# Patient Record
Sex: Female | Born: 1955 | Race: White | Hispanic: No | Marital: Married | Smoking: Former smoker
Health system: Southern US, Community
[De-identification: ages and names within clinical notes are randomized; demographics above are authoritative.]

## PROBLEM LIST (undated history)

## (undated) DIAGNOSIS — E669 Obesity, unspecified: Secondary | ICD-10-CM

## (undated) DIAGNOSIS — K219 Gastro-esophageal reflux disease without esophagitis: Secondary | ICD-10-CM

## (undated) DIAGNOSIS — E119 Type 2 diabetes mellitus without complications: Secondary | ICD-10-CM

## (undated) DIAGNOSIS — K649 Unspecified hemorrhoids: Secondary | ICD-10-CM

## (undated) DIAGNOSIS — N920 Excessive and frequent menstruation with regular cycle: Secondary | ICD-10-CM

## (undated) DIAGNOSIS — K589 Irritable bowel syndrome without diarrhea: Secondary | ICD-10-CM

## (undated) DIAGNOSIS — S42309A Unspecified fracture of shaft of humerus, unspecified arm, initial encounter for closed fracture: Secondary | ICD-10-CM

## (undated) DIAGNOSIS — I499 Cardiac arrhythmia, unspecified: Secondary | ICD-10-CM

## (undated) DIAGNOSIS — I10 Essential (primary) hypertension: Secondary | ICD-10-CM

## (undated) DIAGNOSIS — I639 Cerebral infarction, unspecified: Secondary | ICD-10-CM

## (undated) DIAGNOSIS — S92909A Unspecified fracture of unspecified foot, initial encounter for closed fracture: Secondary | ICD-10-CM

## (undated) HISTORY — DX: Obesity, unspecified: E66.9

## (undated) HISTORY — DX: Cerebral infarction, unspecified: I63.9

## (undated) HISTORY — PX: TUBAL LIGATION: SHX77

## (undated) HISTORY — DX: Unspecified fracture of shaft of humerus, unspecified arm, initial encounter for closed fracture: S42.309A

## (undated) HISTORY — DX: Essential (primary) hypertension: I10

## (undated) HISTORY — DX: Unspecified hemorrhoids: K64.9

## (undated) HISTORY — DX: Irritable bowel syndrome, unspecified: K58.9

## (undated) HISTORY — DX: Excessive and frequent menstruation with regular cycle: N92.0

## (undated) HISTORY — DX: Type 2 diabetes mellitus without complications: E11.9

## (undated) HISTORY — PX: MOUTH SURGERY: SHX715

## (undated) HISTORY — DX: Unspecified fracture of unspecified foot, initial encounter for closed fracture: S92.909A

## (undated) HISTORY — PX: VAGINAL HYSTERECTOMY: SHX2639

---

## 1997-10-01 ENCOUNTER — Other Ambulatory Visit: Admission: RE | Admit: 1997-10-01 | Discharge: 1997-10-01 | Payer: Self-pay | Admitting: Obstetrics and Gynecology

## 1998-10-06 ENCOUNTER — Ambulatory Visit (HOSPITAL_COMMUNITY): Admission: RE | Admit: 1998-10-06 | Discharge: 1998-10-06 | Payer: Self-pay | Admitting: Obstetrics and Gynecology

## 1998-10-06 ENCOUNTER — Other Ambulatory Visit: Admission: RE | Admit: 1998-10-06 | Discharge: 1998-10-06 | Payer: Self-pay | Admitting: Obstetrics and Gynecology

## 1998-10-06 ENCOUNTER — Encounter: Payer: Self-pay | Admitting: Obstetrics and Gynecology

## 2001-09-04 ENCOUNTER — Other Ambulatory Visit: Admission: RE | Admit: 2001-09-04 | Discharge: 2001-09-04 | Payer: Self-pay | Admitting: Obstetrics and Gynecology

## 2004-01-20 ENCOUNTER — Other Ambulatory Visit: Admission: RE | Admit: 2004-01-20 | Discharge: 2004-01-20 | Payer: Self-pay | Admitting: Obstetrics and Gynecology

## 2004-07-26 ENCOUNTER — Ambulatory Visit: Payer: Self-pay | Admitting: Internal Medicine

## 2004-08-17 ENCOUNTER — Ambulatory Visit: Payer: Self-pay | Admitting: Internal Medicine

## 2006-10-19 LAB — HM COLONOSCOPY: HM Colonoscopy: NORMAL

## 2007-01-22 ENCOUNTER — Ambulatory Visit: Payer: Self-pay | Admitting: Internal Medicine

## 2007-02-14 DIAGNOSIS — S42309A Unspecified fracture of shaft of humerus, unspecified arm, initial encounter for closed fracture: Secondary | ICD-10-CM

## 2007-02-14 DIAGNOSIS — S92909A Unspecified fracture of unspecified foot, initial encounter for closed fracture: Secondary | ICD-10-CM

## 2007-02-14 HISTORY — DX: Unspecified fracture of unspecified foot, initial encounter for closed fracture: S92.909A

## 2007-02-14 HISTORY — DX: Unspecified fracture of shaft of humerus, unspecified arm, initial encounter for closed fracture: S42.309A

## 2007-05-23 ENCOUNTER — Inpatient Hospital Stay: Payer: Self-pay | Admitting: Internal Medicine

## 2008-02-14 DIAGNOSIS — I639 Cerebral infarction, unspecified: Secondary | ICD-10-CM

## 2008-02-14 HISTORY — DX: Cerebral infarction, unspecified: I63.9

## 2008-03-25 ENCOUNTER — Inpatient Hospital Stay: Payer: Self-pay | Admitting: Internal Medicine

## 2008-09-25 LAB — HM MAMMOGRAPHY: HM Mammogram: NORMAL

## 2009-08-10 ENCOUNTER — Ambulatory Visit: Payer: Self-pay | Admitting: Internal Medicine

## 2009-08-16 LAB — HM DEXA SCAN

## 2009-11-02 ENCOUNTER — Ambulatory Visit: Payer: Self-pay | Admitting: Internal Medicine

## 2010-05-26 ENCOUNTER — Emergency Department: Payer: Self-pay | Admitting: Emergency Medicine

## 2010-06-08 ENCOUNTER — Ambulatory Visit: Payer: Self-pay | Admitting: Internal Medicine

## 2010-10-21 ENCOUNTER — Telehealth: Payer: Self-pay | Admitting: Internal Medicine

## 2010-10-21 NOTE — Telephone Encounter (Signed)
Called patients work number and got answering machine, called home number and it says that it has been disconnected. I will try calling her work number again on Monday.

## 2010-10-21 NOTE — Telephone Encounter (Signed)
Agree. Lets see her early next week. If she develops any chest pain, chest tightness, sweating, nausea, then she should be seen in the ED over the weekend. It would also help if she records her episodes and when they occur, so we can determine if any triggers.

## 2010-10-21 NOTE — Telephone Encounter (Signed)
Pt's felt her heart tremble /rbh Had ashley to speak to pt

## 2010-10-21 NOTE — Telephone Encounter (Signed)
Spoke with patient . She says that she has had different episodes all lasting around 15 seconds where she can feel her hear shaking. She has no other symptoms, just wanted to schedule appt to have it checked out and to also have her blood pressure checked. She scheduled appt for next week.

## 2010-10-24 NOTE — Telephone Encounter (Signed)
Patient notified

## 2010-10-27 ENCOUNTER — Ambulatory Visit (INDEPENDENT_AMBULATORY_CARE_PROVIDER_SITE_OTHER): Payer: 59 | Admitting: Internal Medicine

## 2010-10-27 ENCOUNTER — Encounter: Payer: Self-pay | Admitting: Internal Medicine

## 2010-10-27 VITALS — BP 161/90 | HR 67 | Temp 97.8°F | Resp 20 | Ht 67.0 in | Wt 232.0 lb

## 2010-10-27 DIAGNOSIS — J329 Chronic sinusitis, unspecified: Secondary | ICD-10-CM

## 2010-10-27 DIAGNOSIS — R002 Palpitations: Secondary | ICD-10-CM

## 2010-10-27 DIAGNOSIS — I10 Essential (primary) hypertension: Secondary | ICD-10-CM

## 2010-10-27 LAB — CBC WITH DIFFERENTIAL/PLATELET
Basophils Absolute: 0 10*3/uL (ref 0.0–0.1)
Basophils Relative: 0.4 % (ref 0.0–3.0)
Eosinophils Absolute: 0.2 10*3/uL (ref 0.0–0.7)
Eosinophils Relative: 2.4 % (ref 0.0–5.0)
HCT: 41.6 % (ref 36.0–46.0)
Hemoglobin: 13.9 g/dL (ref 12.0–15.0)
Lymphocytes Relative: 26.9 % (ref 12.0–46.0)
Lymphs Abs: 2.5 10*3/uL (ref 0.7–4.0)
MCHC: 33.3 g/dL (ref 30.0–36.0)
MCV: 84.7 fl (ref 78.0–100.0)
Monocytes Absolute: 0.7 10*3/uL (ref 0.1–1.0)
Monocytes Relative: 7.8 % (ref 3.0–12.0)
Neutro Abs: 5.8 10*3/uL (ref 1.4–7.7)
Neutrophils Relative %: 62.5 % (ref 43.0–77.0)
Platelets: 173 10*3/uL (ref 150.0–400.0)
RBC: 4.91 Mil/uL (ref 3.87–5.11)
RDW: 14.2 % (ref 11.5–14.6)
WBC: 9.3 10*3/uL (ref 4.5–10.5)

## 2010-10-27 LAB — TSH: TSH: 0.98 u[IU]/mL (ref 0.35–5.50)

## 2010-10-27 LAB — COMPREHENSIVE METABOLIC PANEL
ALT: 92 U/L — ABNORMAL HIGH (ref 0–35)
AST: 56 U/L — ABNORMAL HIGH (ref 0–37)
Albumin: 4 g/dL (ref 3.5–5.2)
Alkaline Phosphatase: 138 U/L — ABNORMAL HIGH (ref 39–117)
BUN: 15 mg/dL (ref 6–23)
CO2: 27 mEq/L (ref 19–32)
Calcium: 9 mg/dL (ref 8.4–10.5)
Chloride: 104 mEq/L (ref 96–112)
Creatinine, Ser: 0.9 mg/dL (ref 0.4–1.2)
GFR: 71.27 mL/min (ref >60.00)
Glucose, Bld: 145 mg/dL — ABNORMAL HIGH (ref 70–99)
Potassium: 3.6 mEq/L (ref 3.5–5.1)
Sodium: 138 mEq/L (ref 135–145)
Total Bilirubin: 0.6 mg/dL (ref 0.3–1.2)
Total Protein: 7.8 g/dL (ref 6.0–8.3)

## 2010-10-27 MED ORDER — AMOXICILLIN-POT CLAVULANATE 875-125 MG PO TABS: 1.0000 | ORAL_TABLET | Freq: Two times a day (BID) | ORAL | Status: AC

## 2010-10-27 NOTE — Progress Notes (Signed)
Subjective:    Patient ID: Katie Bernard, female    DOB: 11-14-1955, 55 y.o.   MRN: [id number]  HPI Ms. Jakubowicz is a 55 year old female with a history of stroke in the distant past who presents for an acute visit today complaining of intermittent palpitations over the last several months and nasal congestion for the past 2 weeks. In regards to the palpitations she says they previously occurred a few times per year. They're noted when she is resting. They're described as a fluttering in her chest. She does not have chest pain, diaphoresis, shortness of breath, or other symptoms during these episodes. The episodes resolve with rest alone. They typically last just a few seconds. They do not seem to be related to stress or exertion. Patient became concerned about these episodes after a colleague recently died from sudden cardiac death. She is concerned about the possibility of ventricular fibrillation.  In regards to her nasal congestion, she reports progressive nasal congestion with purulent rhinorrhea over the last 2 weeks. She describes a frontal headache. She has been using her Flonase with no improvement in symptoms. She has also had sore throat and mild ear pain bilaterally. She denies any fever, shortness of breath, cough or other symptoms. She has several known sick contacts as she cares for her 4 grandchildren.  Outpatient Encounter Prescriptions as of 10/27/2010  Medication Sig Dispense Refill  . amoxicillin-clavulanate (AUGMENTIN) 875-125 MG per tablet Take 1 tablet by mouth 2 (two) times daily.  20 tablet  0  . fluticasone (FLONASE) 50 MCG/ACT nasal spray         Review of Systems  Constitutional: Negative for fever, chills, diaphoresis, appetite change, fatigue and unexpected weight change.  HENT: Positive for congestion, sore throat, postnasal drip and sinus pressure. Negative for ear pain, trouble swallowing, neck pain and voice change.   Eyes: Negative for visual disturbance.  Respiratory:  Negative for cough, shortness of breath, wheezing and stridor.   Cardiovascular: Positive for palpitations. Negative for chest pain and leg swelling.  Gastrointestinal: Negative for nausea, vomiting, abdominal pain, diarrhea, constipation, blood in stool, abdominal distention and anal bleeding.  Genitourinary: Negative for dysuria and flank pain.  Musculoskeletal: Negative for myalgias, arthralgias and gait problem.  Skin: Negative for color change and rash.  Neurological: Negative for dizziness, weakness, light-headedness, numbness and headaches.  Hematological: Negative for adenopathy. Does not bruise/bleed easily.  Psychiatric/Behavioral: Negative for suicidal ideas, sleep disturbance and dysphoric mood. The patient is not nervous/anxious.     BP 161/90  Pulse 67  Temp(Src) 97.8 F (36.6 C) (Oral)  Resp 20  Ht 5\' 7"  (1.702 m)  Wt 232 lb (105.235 kg)  BMI 36.34 kg/m2  SpO2 99%     Objective:   Physical Exam  Constitutional: She is oriented to person, place, and time. She appears well-developed and well-nourished. No distress.  HENT:  Head: Normocephalic and atraumatic.  Right Ear: Hearing, external ear and ear canal normal. A middle ear effusion is present.  Left Ear: Hearing, external ear and ear canal normal. A middle ear effusion is present.  Nose: Mucosal edema present. Right sinus exhibits frontal sinus tenderness. Left sinus exhibits frontal sinus tenderness.  Mouth/Throat: Oropharynx is clear and moist. No oropharyngeal exudate.  Eyes: Conjunctivae are normal. Pupils are equal, round, and reactive to light. Right eye exhibits no discharge. Left eye exhibits no discharge. No scleral icterus.  Neck: Normal range of motion. Neck supple. Carotid bruit is not present. No tracheal deviation present. No  thyromegaly present.  Cardiovascular: Normal rate, regular rhythm, normal heart sounds and intact distal pulses.  Exam reveals no gallop and no friction rub.   No murmur  heard. Pulmonary/Chest: Effort normal and breath sounds normal. No respiratory distress. She has no wheezes. She has no rales. She exhibits no tenderness.  Musculoskeletal: Normal range of motion. She exhibits no edema and no tenderness.  Lymphadenopathy:    She has no cervical adenopathy.  Neurological: She is alert and oriented to person, place, and time. No cranial nerve deficit. She exhibits normal muscle tone. Coordination normal.  Skin: Skin is warm and dry. No rash noted. She is not diaphoretic. No erythema. No pallor.  Psychiatric: She has a normal mood and affect. Her behavior is normal. Judgment and thought content normal.          Assessment & Plan:  1. Palpitations - Patient with recent recent increased frequency of palpitations.  Suspect related to anxiety.EKG performed today was normal. Will check electrolytes and thyroid function with labs. BP is up slightly today, but has been low-normal on previous visits. If it continues to be elevated, she might benefit from a betablocker. We will also set her up for cardiology eval and stress test. She will return to clinic in one month.  2. Frontal Sinusitis - patient with bilateral frontal sinusitis. Will treat empirically with Augmentin. She will use ibuprofen as needed for pain and inflammation. She will return to clinic in one month or call earlier should her symptoms not improve.

## 2010-10-27 NOTE — Patient Instructions (Signed)
EKG and labs today. We will set you up with Cardiology for stress test.  Follow up in 1 month.

## 2010-11-08 ENCOUNTER — Ambulatory Visit (INDEPENDENT_AMBULATORY_CARE_PROVIDER_SITE_OTHER): Payer: 59 | Admitting: Cardiovascular Disease

## 2010-11-08 ENCOUNTER — Encounter: Payer: Self-pay | Admitting: Cardiovascular Disease

## 2010-11-08 DIAGNOSIS — R002 Palpitations: Secondary | ICD-10-CM | POA: Insufficient documentation

## 2010-11-08 DIAGNOSIS — I10 Essential (primary) hypertension: Secondary | ICD-10-CM

## 2010-11-08 NOTE — Patient Instructions (Addendum)
You are doing well. Please start aspriin 81 mg daily Try red yeast rice Please call us if you have new issues that need to be addressed  Call when your sinus infection has resolved for a treadmill study.

## 2010-11-08 NOTE — Assessment & Plan Note (Addendum)
Her heart palpitations are rare, very short-lived. He would be very challenging to catch this on a Holter or event monitor as she has only had 3 episodes this year. She is not interested in any medication at this time for her palpitations. He did suggest that if her symptoms start occurring more frequently or for longer duration, that she call us for further evaluation. Her palpitations could have been APCs, PVCs or possibly short run of SVT. If a blood pressure medication as needed, we could take a low-dose beta blocker such as bystolic. She did bring walking shoes with her for possible treadmill study. As she is having difficulty breathing secondary to her sinus infection, we will delay a treadmill study for another day and do this at her convenience.

## 2010-11-08 NOTE — Assessment & Plan Note (Signed)
Blood pressure is borderline elevated today though she does have a sinus infection. We have asked her to monitor her blood pressure at home after she improves.

## 2010-11-08 NOTE — Progress Notes (Signed)
   Patient ID: Katie Bernard, female    DOB: 1956-02-13, 55 y.o.   MRN: [id number]  HPI Comments: Ms. 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 Dan Humphreys for symptoms of palpitations.  She has been fighting a sinus infection for the past 3-4 weeks.  She reports having a sinus infection over the past month. She has completed the amoxicillin for 10 days and has been off the antibiotics for 4 days. She continues to have ear congestion, nasal sinus congestion and headache. She does have mild possible postnasal drip.  She otherwise feels well. She does report a recent episode where a coworker fell down in the parking lot and despite attempts for resuscitation, he died. Since then, she is aware of any extra beats that she might have. She does report 3 episodes total of palpitations lasting for several seconds at a time. He most recent was around Labor Day soon after her friend/coworker passed. She had 3 seconds of fast beating while lying back in a recliner. This resolved after sitting forward. She is otherwise reactive, denies any chest pain, shortness of breath. She denies any palpitations that keep her awake at night time. She takes care of 4 children, works full-time and is married.  EKG shows normal sinus rhythm with rate 70 beats per minute with no significant ST or T wave changes   Outpatient Encounter Prescriptions as of 11/08/2010  Medication Sig Dispense Refill  . fluticasone (FLONASE) 50 MCG/ACT nasal spray          Review of Systems  Constitutional: Negative.   HENT: Negative.   Eyes: Negative.   Respiratory: Negative.   Cardiovascular: Positive for palpitations.  Gastrointestinal: Negative.   Musculoskeletal: Negative.   Skin: Negative.   Neurological: Negative.   Hematological: Negative.   Psychiatric/Behavioral: Negative.     All other systems reviewed and are negative.    BP 155/98  Pulse 70  Ht 5\' 7"  (1.702 m)  Wt 230 lb 12 oz (104.668 kg)  BMI 36.14 kg/m2   Physical Exam  Nursing note and vitals reviewed. Constitutional: She is oriented to person, place, and time. She appears well-developed and well-nourished.  HENT:  Head: Normocephalic.       Nasal congestion  Eyes: Conjunctivae are normal. Pupils are equal, round, and reactive to light.  Neck: Normal range of motion. Neck supple. No JVD present.  Cardiovascular: Normal rate, regular rhythm, S1 normal, S2 normal, normal heart sounds and intact distal pulses.  Exam reveals no gallop and no friction rub.   No murmur heard. Pulmonary/Chest: Effort normal and breath sounds normal. No respiratory distress. She has no wheezes. She has no rales. She exhibits no tenderness.  Abdominal: Soft. Bowel sounds are normal. She exhibits no distension. There is no tenderness.  Musculoskeletal: Normal range of motion. She exhibits no edema and no tenderness.  Lymphadenopathy:    She has no cervical adenopathy.  Neurological: She is alert and oriented to person, place, and time. Coordination normal.  Skin: Skin is warm and dry. No rash noted. No erythema.  Psychiatric: She has a normal mood and affect. Her behavior is normal. Judgment and thought content normal.         Assessment and Plan

## 2010-11-09 ENCOUNTER — Telehealth: Payer: Self-pay | Admitting: Internal Medicine

## 2010-11-09 NOTE — Telephone Encounter (Signed)
Patient request rx to be called in due to sinusitis symptoms still existing.

## 2010-11-09 NOTE — Telephone Encounter (Signed)
Called in Rx to [facility] and informed patient to try Levaquin and if this doesn't work to call us back to setup an appt.

## 2010-11-09 NOTE — Telephone Encounter (Signed)
We can try changing her to Levaquin 750mg  daily. Disp 10.  If this does not work, then we should see her back.

## 2010-11-09 NOTE — Telephone Encounter (Signed)
After 5  Call [phone]  Pt is better but not well is there is anything else she can take.  Her head is congested can't hear mucas is still dark green still has cough [facility]  [place] D[place]

## 2010-11-23 ENCOUNTER — Ambulatory Visit (INDEPENDENT_AMBULATORY_CARE_PROVIDER_SITE_OTHER): Payer: 59 | Admitting: Internal Medicine

## 2010-11-23 ENCOUNTER — Encounter: Payer: Self-pay | Admitting: Internal Medicine

## 2010-11-23 VITALS — BP 146/93 | HR 66 | Temp 98.6°F | Resp 20 | Ht 67.0 in | Wt 233.2 lb

## 2010-11-23 DIAGNOSIS — J0100 Acute maxillary sinusitis, unspecified: Secondary | ICD-10-CM

## 2010-11-23 DIAGNOSIS — J329 Chronic sinusitis, unspecified: Secondary | ICD-10-CM

## 2010-11-23 MED ORDER — SULFAMETHOXAZOLE-TRIMETHOPRIM 800-160 MG PO TABS
1.0000 | ORAL_TABLET | Freq: Two times a day (BID) | ORAL | Status: AC
Start: 2010-11-23 — End: 2010-12-03

## 2010-11-23 NOTE — Patient Instructions (Signed)
Call if symptoms not improving by Friday.

## 2010-11-23 NOTE — Progress Notes (Signed)
Subjective:    Patient ID: Katie Bernard, female    DOB: May 15, 1955, 55 y.o.   MRN: [id number]  HPI Katie Bernard is a 55 year old female with a recent history of bilateral maxillary sinusitis who presents for followup. Last month, she was treated with Levaquin for sinusitis. She reports modest improvement after this course of antibiotics. However, she subsequently had a recurrence of her symptoms including facial pain and purulent sinus drainage. She denies fever or chills.  Outpatient Encounter Prescriptions as of 11/23/2010  Medication Sig Dispense Refill  . fluticasone (FLONASE) 50 MCG/ACT nasal spray         Review of Systems  Constitutional: Negative for fever, chills, appetite change, fatigue and unexpected weight change.  HENT: Positive for ear pain, congestion and sinus pressure. Negative for sore throat, trouble swallowing, neck pain and voice change.   Eyes: Negative for visual disturbance.  Respiratory: Negative for cough, shortness of breath, wheezing and stridor.   Cardiovascular: Negative for chest pain, palpitations and leg swelling.  Gastrointestinal: Negative for nausea, vomiting, abdominal pain, diarrhea, constipation, blood in stool, abdominal distention and anal bleeding.  Genitourinary: Negative for dysuria and flank pain.  Musculoskeletal: Negative for myalgias, arthralgias and gait problem.  Skin: Negative for color change and rash.  Neurological: Positive for headaches. Negative for dizziness.  Hematological: Negative for adenopathy. Does not bruise/bleed easily.  Psychiatric/Behavioral: Negative for suicidal ideas, sleep disturbance and dysphoric mood. The patient is not nervous/anxious.    BP 146/93  Pulse 66  Temp(Src) 98.6 F (37 C) (Oral)  Resp 20  Ht 5\' 7"  (1.702 m)  Wt 233 lb 4 oz (105.802 kg)  BMI 36.53 kg/m2  SpO2 96%     Objective:   Physical Exam  Constitutional: She is oriented to person, place, and time. She appears well-developed and  well-nourished. No distress.  HENT:  Head: Normocephalic and atraumatic.  Right Ear: External ear normal. Tympanic membrane is not erythematous. A middle ear effusion is present.  Left Ear: External ear normal. Tympanic membrane is erythematous. A middle ear effusion is present.  Nose: Mucosal edema present. Right sinus exhibits maxillary sinus tenderness and frontal sinus tenderness. Left sinus exhibits maxillary sinus tenderness and frontal sinus tenderness.  Mouth/Throat: Oropharynx is clear and moist. No oropharyngeal exudate.  Eyes: Conjunctivae are normal. Pupils are equal, round, and reactive to light. Right eye exhibits no discharge. Left eye exhibits no discharge. No scleral icterus.  Neck: Normal range of motion. Neck supple. No tracheal deviation present. No thyromegaly present.  Cardiovascular: Normal rate, regular rhythm, normal heart sounds and intact distal pulses.  Exam reveals no gallop and no friction rub.   No murmur heard. Pulmonary/Chest: Effort normal and breath sounds normal. No respiratory distress. She has no wheezes. She has no rales. She exhibits no tenderness.  Musculoskeletal: Normal range of motion. She exhibits no edema and no tenderness.  Lymphadenopathy:    She has no cervical adenopathy.  Neurological: She is alert and oriented to person, place, and time. No cranial nerve deficit. She exhibits normal muscle tone. Coordination normal.  Skin: Skin is warm and dry. No rash noted. She is not diaphoretic. No erythema. No pallor.  Psychiatric: She has a normal mood and affect. Her behavior is normal. Judgment and thought content normal.          Assessment & Plan:  1. Sinusitis - patient with bilateral maxillary sinusitis. She has been told in the past that she has a deviated septum which may require  intervention. Will repeat course of antibiotics, this time using Bactrim with hope of improvement with better staph coverage. Should symptoms persist, patient will  followup with her ENT physician for further evaluation possibly to include culture.

## 2010-12-21 ENCOUNTER — Ambulatory Visit: Payer: 59 | Admitting: Internal Medicine

## 2011-02-02 ENCOUNTER — Encounter: Payer: Self-pay | Admitting: Internal Medicine

## 2011-02-02 ENCOUNTER — Ambulatory Visit (INDEPENDENT_AMBULATORY_CARE_PROVIDER_SITE_OTHER): Payer: 59 | Admitting: Internal Medicine

## 2011-02-02 VITALS — BP 140/86 | HR 75 | Temp 97.4°F | Wt 234.0 lb

## 2011-02-02 DIAGNOSIS — J111 Influenza due to unidentified influenza virus with other respiratory manifestations: Secondary | ICD-10-CM

## 2011-02-02 DIAGNOSIS — J101 Influenza due to other identified influenza virus with other respiratory manifestations: Secondary | ICD-10-CM

## 2011-02-02 DIAGNOSIS — R509 Fever, unspecified: Secondary | ICD-10-CM

## 2011-02-02 LAB — POCT INFLUENZA A/B
Influenza A, POC: POSITIVE
Influenza B, POC: POSITIVE

## 2011-02-02 MED ORDER — OSELTAMIVIR PHOSPHATE 75 MG PO CAPS: 75.0000 mg | ORAL_CAPSULE | Freq: Two times a day (BID) | ORAL | Status: AC

## 2011-02-02 NOTE — Progress Notes (Signed)
  Subjective:    Patient ID: Katie Bernard, female    DOB: [birth year], 55 y.o.   MRN: [id number]  HPI 55 year old female with a history of stroke presents with a 24-hour history of fever, chills, myalgia, nasal congestion, bilateral ear pain. She also reports cough productive of yellow sputum. She has been using over-the-counter cough and cold preparations with no improvement. She notes that several family members have been ill with similar symptoms. She reports severe fatigue today and left work early.  Review of Systems  Constitutional: Positive for fever, chills and fatigue.  HENT: Positive for ear pain, congestion, rhinorrhea, sneezing and sinus pressure.   Respiratory: Positive for cough. Negative for shortness of breath.   Cardiovascular: Negative for chest pain.  Gastrointestinal: Negative for abdominal pain.  Musculoskeletal: Positive for myalgias and arthralgias.   BP 140/86  Pulse 75  Temp(Src) 97.4 F (36.3 C) (Oral)  Wt 234 lb (106.142 kg)  SpO2 98%     Objective:   Physical Exam  Constitutional: She is oriented to person, place, and time. She appears well-developed and well-nourished. No distress.  HENT:  Head: Normocephalic and atraumatic.  Right Ear: External ear normal. Tympanic membrane is erythematous. A middle ear effusion is present.  Left Ear: External ear normal. Tympanic membrane is erythematous. A middle ear effusion is present.  Nose: Nose normal.  Mouth/Throat: Posterior oropharyngeal erythema present. No oropharyngeal exudate.  Eyes: Conjunctivae are normal. Pupils are equal, round, and reactive to light. Right eye exhibits no discharge. Left eye exhibits no discharge. No scleral icterus.  Neck: Normal range of motion. Neck supple. No tracheal deviation present. No thyromegaly present.  Cardiovascular: Normal rate, regular rhythm, normal heart sounds and intact distal pulses.  Exam reveals no gallop and no friction rub.   No murmur heard. Pulmonary/Chest:  Effort normal and breath sounds normal. No respiratory distress. She has no wheezes. She has no rales. She exhibits no tenderness.  Musculoskeletal: Normal range of motion. She exhibits no edema and no tenderness.  Lymphadenopathy:    She has no cervical adenopathy.  Neurological: She is alert and oriented to person, place, and time. No cranial nerve deficit. She exhibits normal muscle tone. Coordination normal.  Skin: Skin is warm and dry. No rash noted. She is not diaphoretic. No erythema. No pallor.  Psychiatric: She has a normal mood and affect. Her behavior is normal. Judgment and thought content normal.     Rapid flu positive     Assessment & Plan:  1. Influenza - Symptoms started 24hr ago. Will start Tamiflu 75mg  po bid c5 days to help shorten course.  Symptom management with tylenol,ibuprofen, sudafed prn. Follow up if symptoms are not improving next 72hr.

## 2011-02-02 NOTE — Patient Instructions (Signed)
Influenza Facts Flu (influenza) is a contagious respiratory illness caused by the influenza viruses. It can cause mild to severe illness. While most healthy people recover from the flu without specific treatment and without complications, older people, young children, and people with certain health conditions are at higher risk for serious complications from the flu, including death. CAUSES   The flu virus is spread from person to person by respiratory droplets from coughing and sneezing.   A person can also become infected by touching an object or surface with a virus on it and then touching their mouth, eye or nose.   Adults may be able to infect others from 1 day before symptoms occur and up to 7 days after getting sick. So it is possible to give someone the flu even before you know you are sick and continue to infect others while you are sick.  SYMPTOMS   Fever (usually high).   Headache.   Tiredness (can be extreme).   Cough.   Sore throat.   Runny or stuffy nose.   Body aches.   Diarrhea and vomiting may also occur, particularly in children.   These symptoms are referred to as "flu-like symptoms". A lot of different illnesses, including the common cold, can have similar symptoms.  DIAGNOSIS   There are tests that can determine if you have the flu as long you are tested within the first 2 or 3 days of illness.   A doctor's exam and additional tests may be needed to identify if you have a disease that is a complicating the flu.  RISKS AND COMPLICATIONS  Some of the complications caused by the flu include:  Bacterial pneumonia or progressive pneumonia caused by the flu virus.   Loss of body fluids (dehydration).   Worsening of chronic medical conditions, such as heart failure, asthma, or diabetes.   Sinus problems and ear infections.  HOME CARE INSTRUCTIONS   Seek medical care early on.   If you are at high risk from complications of the flu, consult your health-care  provider as soon as you develop flu-like symptoms. Those at high risk for complications include:   People 65 years or older.   People with chronic medical conditions, including diabetes.   Pregnant women.   Young children.   Your caregiver may recommend use of an antiviral medication to help treat the flu.   If you get the flu, get plenty of rest, drink a lot of liquids, and avoid using alcohol and tobacco.   You can take over-the-counter medications to relieve the symptoms of the flu if your caregiver approves. (Never give aspirin to children or teenagers who have flu-like symptoms, particularly fever).  PREVENTION  The single best way to prevent the flu is to get a flu vaccine each fall. Other measures that can help protect against the flu are:  Antiviral Medications   A number of antiviral drugs are approved for use in preventing the flu. These are prescription medications, and a doctor should be consulted before they are used.   Habits for Good Health   Cover your nose and mouth with a tissue when you cough or sneeze, throw the tissue away after you use it.   Wash your hands often with soap and water, especially after you cough or sneeze. If you are not near water, use an alcohol-based hand cleaner.   Avoid people who are sick.   If you get the flu, stay home from work or school. Avoid contact with   other people so that you do not make them sick, too.   Try not to touch your eyes, nose, or mouth as germs ore often spread this way.  IN CHILDREN, EMERGENCY WARNING SIGNS THAT NEED URGENT MEDICAL ATTENTION:  Fast breathing or trouble breathing.   Bluish skin color.   Not drinking enough fluids.   Not waking up or not interacting.   Being so irritable that the child does not want to be held.   Flu-like symptoms improve but then return with fever and worse cough.   Fever with a rash.  IN ADULTS, EMERGENCY WARNING SIGNS THAT NEED URGENT MEDICAL ATTENTION:  Difficulty  breathing or shortness of breath.   Pain or pressure in the chest or abdomen.   Sudden dizziness.   Confusion.   Severe or persistent vomiting.  SEEK IMMEDIATE MEDICAL CARE IF:  You or someone you know is experiencing any of the symptoms above. When you arrive at the emergency center,report that you think you have the flu. You may be asked to wear a mask and/or sit in a secluded area to protect others from getting sick. MAKE SURE YOU:   Understand these instructions.   Monitor your condition.   Seek medical care if you are getting worse, or not improving.  Document Released: 02/02/2003 Document Revised: 10/12/2010 Document Reviewed: 10/29/2008 [employer] Patient Information 2012 [employer]. 

## 2011-02-16 ENCOUNTER — Encounter: Payer: Self-pay | Admitting: Internal Medicine

## 2011-04-05 ENCOUNTER — Other Ambulatory Visit: Payer: Self-pay | Admitting: Internal Medicine

## 2011-07-12 ENCOUNTER — Ambulatory Visit (INDEPENDENT_AMBULATORY_CARE_PROVIDER_SITE_OTHER): Payer: 59 | Admitting: Internal Medicine

## 2011-07-12 ENCOUNTER — Encounter: Payer: Self-pay | Admitting: Internal Medicine

## 2011-07-12 VITALS — BP 143/89 | HR 70 | Temp 98.3°F | Ht 67.0 in | Wt 240.0 lb

## 2011-07-12 DIAGNOSIS — R5383 Other fatigue: Secondary | ICD-10-CM | POA: Insufficient documentation

## 2011-07-12 DIAGNOSIS — Z1239 Encounter for other screening for malignant neoplasm of breast: Secondary | ICD-10-CM

## 2011-07-12 DIAGNOSIS — E669 Obesity, unspecified: Secondary | ICD-10-CM

## 2011-07-12 MED ORDER — PHENTERMINE HCL 37.5 MG PO CAPS: 37.5000 mg | ORAL_CAPSULE | ORAL | Status: DC

## 2011-07-12 NOTE — Progress Notes (Signed)
Subjective:    Patient ID: Katie Bernard, female    DOB: 23-May-1955, 57 y.o.   MRN: [id number]  HPI 56YO female presents for acute visit. C/o fatigue over last few months. Typically sleeps from 10:30pm until 5:30am, however feels extremely fatigued during day at work. Notes some snoring with sleep. No apnea.  Denies focal symptoms such as fever, chills, chest pain, dyspnea.  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  Also reports difficulty losing weight. Has been limiting caloric intake, typically eating oatmeal for breakfast, salads for lunch. No sweetened beverages. Occasional candy. Does not have time for exercise. Unable to lose weight.  Outpatient Encounter Prescriptions as of 07/12/2011  Medication Sig Dispense Refill  . fluticasone (FLONASE) 50 MCG/ACT nasal spray USE 1 SPRAY IN EACH NOSTRIL DAILY  2 g  2  . phentermine 37.5 MG capsule Take 1 capsule (37.5 mg total) by mouth every morning.  30 capsule  1    Review of Systems  Constitutional: Positive for fatigue. Negative for fever, chills, appetite change and unexpected weight change.  HENT: Negative for ear pain, congestion, sore throat, trouble swallowing, neck pain, voice change and sinus pressure.   Eyes: Negative for visual disturbance.  Respiratory: Negative for cough, shortness of breath, wheezing and stridor.   Cardiovascular: Negative for chest pain, palpitations and leg swelling.  Gastrointestinal: Negative for nausea, vomiting, abdominal pain, diarrhea, constipation, blood in stool, abdominal distention and anal bleeding.  Genitourinary: Negative for dysuria and flank pain.  Musculoskeletal: Negative for myalgias, arthralgias and gait problem.  Skin: Negative for color change and rash.  Neurological: Negative for dizziness and headaches.  Hematological: Negative for adenopathy. Does not bruise/bleed easily.  Psychiatric/Behavioral: Negative for suicidal ideas, sleep disturbance and dysphoric mood. The patient is not  nervous/anxious.    BP 143/89  Pulse 70  Temp(Src) 98.3 F (36.8 C) (Oral)  Ht 5\' 7"  (1.702 m)  Wt 240 lb (108.863 kg)  BMI 37.59 kg/m2  SpO2 96%     Objective:   Physical Exam  Constitutional: She is oriented to person, place, and time. She appears well-developed and well-nourished. No distress.  HENT:  Head: Normocephalic and atraumatic.  Right Ear: External ear normal.  Left Ear: External ear normal.  Nose: Nose normal.  Mouth/Throat: Oropharynx is clear and moist. No oropharyngeal exudate.  Eyes: Conjunctivae are normal. Pupils are equal, round, and reactive to light. Right eye exhibits no discharge. Left eye exhibits no discharge. No scleral icterus.  Neck: Normal range of motion. Neck supple. No tracheal deviation present. No thyromegaly present.  Cardiovascular: Normal rate, regular rhythm, normal heart sounds and intact distal pulses.  Exam reveals no gallop and no friction rub.   No murmur heard. Pulmonary/Chest: Effort normal and breath sounds normal. No respiratory distress. She has no wheezes. She has no rales. She exhibits no tenderness.  Abdominal: Soft. Bowel sounds are normal. She exhibits no distension and no mass. There is no tenderness. There is no guarding.  Musculoskeletal: Normal range of motion. She exhibits no edema and no tenderness.  Lymphadenopathy:    She has no cervical adenopathy.  Neurological: She is alert and oriented to person, place, and time. No cranial nerve deficit. She exhibits normal muscle tone. Coordination normal.  Skin: Skin is warm and dry. No rash noted. She is not diaphoretic. No erythema. No pallor.  Psychiatric: She has a normal mood and affect. Her behavior is normal. Judgment and thought content normal.  Assessment & Plan:

## 2011-07-12 NOTE — Assessment & Plan Note (Signed)
Unclear etiology. Will check CBC, CMP, TSH, B12 with labs. Will also set up pt for sleep study given snoring and daytime somnolence. Follow up 1 month.

## 2011-07-12 NOTE — Assessment & Plan Note (Signed)
BMI 37.  Encouraged continued efforts at healthy diet and regular physical activity (however time to exercise limited by caring for children and work).  Will start phentermine to help with appetite. Follow up 1 month.

## 2011-07-13 LAB — COMPREHENSIVE METABOLIC PANEL
ALT: 92 U/L — ABNORMAL HIGH (ref 0–35)
Albumin: 4.4 g/dL (ref 3.5–5.2)
Alkaline Phosphatase: 106 U/L (ref 39–117)
Potassium: 4.1 mEq/L (ref 3.5–5.1)
Sodium: 140 mEq/L (ref 135–145)
Total Bilirubin: 0.5 mg/dL (ref 0.3–1.2)
Total Protein: 7.7 g/dL (ref 6.0–8.3)

## 2011-07-13 LAB — CBC WITH DIFFERENTIAL/PLATELET
Eosinophils Absolute: 0.2 10*3/uL (ref 0.0–0.7)
Eosinophils Relative: 3.5 % (ref 0.0–5.0)
Lymphocytes Relative: 42.5 % (ref 12.0–46.0)
MCHC: 32.2 g/dL (ref 30.0–36.0)
MCV: 85.9 fl (ref 78.0–100.0)
Monocytes Absolute: 0.6 10*3/uL (ref 0.1–1.0)
Neutrophils Relative %: 45.3 % (ref 43.0–77.0)
Platelets: 89 10*3/uL — ABNORMAL LOW (ref 150.0–400.0)
RBC: 5.16 Mil/uL — ABNORMAL HIGH (ref 3.87–5.11)
WBC: 6.8 10*3/uL (ref 4.5–10.5)

## 2011-07-13 LAB — TSH: TSH: 2.27 u[IU]/mL (ref 0.35–5.50)

## 2011-07-24 ENCOUNTER — Telehealth: Payer: Self-pay | Admitting: Internal Medicine

## 2011-07-24 NOTE — Telephone Encounter (Signed)
Sleep apnea - mild

## 2011-08-02 ENCOUNTER — Encounter: Payer: Self-pay | Admitting: Internal Medicine

## 2011-08-02 ENCOUNTER — Ambulatory Visit (INDEPENDENT_AMBULATORY_CARE_PROVIDER_SITE_OTHER): Payer: 59 | Admitting: Internal Medicine

## 2011-08-02 VITALS — BP 118/82 | HR 76 | Temp 98.3°F | Ht 67.0 in | Wt 220.5 lb

## 2011-08-02 DIAGNOSIS — H6691 Otitis media, unspecified, right ear: Secondary | ICD-10-CM | POA: Insufficient documentation

## 2011-08-02 DIAGNOSIS — E669 Obesity, unspecified: Secondary | ICD-10-CM

## 2011-08-02 DIAGNOSIS — R739 Hyperglycemia, unspecified: Secondary | ICD-10-CM

## 2011-08-02 DIAGNOSIS — Z23 Encounter for immunization: Secondary | ICD-10-CM

## 2011-08-02 DIAGNOSIS — R7309 Other abnormal glucose: Secondary | ICD-10-CM

## 2011-08-02 DIAGNOSIS — R7989 Other specified abnormal findings of blood chemistry: Secondary | ICD-10-CM

## 2011-08-02 DIAGNOSIS — D696 Thrombocytopenia, unspecified: Secondary | ICD-10-CM

## 2011-08-02 DIAGNOSIS — H6690 Otitis media, unspecified, unspecified ear: Secondary | ICD-10-CM

## 2011-08-02 MED ORDER — AMOXICILLIN-POT CLAVULANATE 875-125 MG PO TABS: 1.0000 | ORAL_TABLET | Freq: Two times a day (BID) | ORAL | Status: AC

## 2011-08-02 NOTE — Assessment & Plan Note (Signed)
Symptoms and exam consistent with right otitis media. Will treat with Augmentin. Patient may use ibuprofen as needed for pain. Patient will call if symptoms are persistent over the next 48 hours.

## 2011-08-02 NOTE — Assessment & Plan Note (Signed)
Blood sugar was elevated on labs from one month ago. Will send hemoglobin A1c with testing today.

## 2011-08-02 NOTE — Assessment & Plan Note (Signed)
20 pound weight loss over last month. Encouraged patient to continue efforts at healthy diet and regular physical activity. Will continue phentermine for appetite suppression. Followup in one month.

## 2011-08-02 NOTE — Progress Notes (Signed)
Subjective:    Patient ID: Katie Bernard, female    DOB: Jul 19, 1955, 56 y.o.   MRN: [id number]  HPI 56 year old female with history of obesity presents for followup. Over the last month, she has significantly changed her diet and has lost nearly 20 pounds. She has also been using phentermine to help with appetite suppression. She denies any side effects from this medication. She reports that she is generally feeling well.  At her last visit, she complained of fatigue. She underwent sleep study which was normal. She also had extensive lab work including CBC which was remarkable for slightly low platelet count at 89. LFTs were also noted to be moderately elevated. She notes that her husband has hepatitis C. She has never been checked for this. She reports that in general her fatigue has improved.  She is concerned today about right ear pain. This has been persistent for several days. She also notes increased nasal congestion. She denies any cough, shortness of breath, fever, chills. She has not been taking any medication for this.  Outpatient Encounter Prescriptions as of 08/02/2011  Medication Sig Dispense Refill  . fluticasone (FLONASE) 50 MCG/ACT nasal spray USE 1 SPRAY IN EACH NOSTRIL DAILY  2 g  2  . phentermine 37.5 MG capsule Take 1 capsule (37.5 mg total) by mouth every morning.  30 capsule  1  . amoxicillin-clavulanate (AUGMENTIN) 875-125 MG per tablet Take 1 tablet by mouth 2 (two) times daily.  20 tablet  0    Review of Systems  Constitutional: Negative for fever, chills, appetite change, fatigue and unexpected weight change.  HENT: Positive for ear pain and congestion. Negative for sore throat, trouble swallowing, neck pain, voice change and sinus pressure.   Eyes: Negative for visual disturbance.  Respiratory: Negative for cough, shortness of breath, wheezing and stridor.   Cardiovascular: Negative for chest pain, palpitations and leg swelling.  Gastrointestinal: Negative for  nausea, vomiting, abdominal pain, diarrhea, constipation, blood in stool, abdominal distention and anal bleeding.  Genitourinary: Negative for dysuria and flank pain.  Musculoskeletal: Negative for myalgias, arthralgias and gait problem.  Skin: Negative for color change and rash.  Neurological: Negative for dizziness and headaches.  Hematological: Negative for adenopathy. Does not bruise/bleed easily.  Psychiatric/Behavioral: Negative for suicidal ideas, disturbed wake/sleep cycle and dysphoric mood. The patient is not nervous/anxious.    BP 118/82  Pulse 76  Temp 98.3 F (36.8 C) (Oral)  Ht 5\' 7"  (1.702 m)  Wt 220 lb 8 oz (100.018 kg)  BMI 34.54 kg/m2  SpO2 96%     Objective:   Physical Exam  Constitutional: She is oriented to person, place, and time. She appears well-developed and well-nourished. No distress.  HENT:  Head: Normocephalic and atraumatic.  Right Ear: External ear normal. Tympanic membrane is erythematous and bulging. No middle ear effusion.  Left Ear: Tympanic membrane, external ear and ear canal normal.  Nose: Nose normal.  Mouth/Throat: Oropharynx is clear and moist. No oropharyngeal exudate.  Eyes: Conjunctivae are normal. Pupils are equal, round, and reactive to light. Right eye exhibits no discharge. Left eye exhibits no discharge. No scleral icterus.  Neck: Normal range of motion. Neck supple. No tracheal deviation present. No thyromegaly present.  Cardiovascular: Normal rate, regular rhythm, normal heart sounds and intact distal pulses.  Exam reveals no gallop and no friction rub.   No murmur heard. Pulmonary/Chest: Effort normal and breath sounds normal. No respiratory distress. She has no wheezes. She has no rales. She exhibits  no tenderness.  Abdominal: Soft. Bowel sounds are normal. She exhibits no distension and no mass. There is no hepatosplenomegaly. There is no tenderness. There is no guarding.  Musculoskeletal: Normal range of motion. She exhibits no  edema and no tenderness.  Lymphadenopathy:    She has no cervical adenopathy.  Neurological: She is alert and oriented to person, place, and time. No cranial nerve deficit. She exhibits normal muscle tone. Coordination normal.  Skin: Skin is warm and dry. No rash noted. She is not diaphoretic. No erythema. No pallor.  Psychiatric: She has a normal mood and affect. Her behavior is normal. Judgment and thought content normal.          Assessment & Plan:

## 2011-08-02 NOTE — Assessment & Plan Note (Signed)
LFTs noted to be elevated on labs. Patient's husband has hepatitis C. Will screen patient for hepatitis C and repeat LFTs. Will also get right upper quadrant ultrasound. Followup one month.

## 2011-08-02 NOTE — Assessment & Plan Note (Signed)
Platelets noted to be low on labs from one month ago. Will repeat today.

## 2011-08-03 ENCOUNTER — Encounter: Payer: Self-pay | Admitting: Internal Medicine

## 2011-08-03 LAB — HEMOGLOBIN A1C: Hgb A1c MFr Bld: 7.5 % — ABNORMAL HIGH (ref 4.6–6.5)

## 2011-08-03 LAB — COMPREHENSIVE METABOLIC PANEL
Albumin: 4.3 g/dL (ref 3.5–5.2)
CO2: 25 mEq/L (ref 19–32)
GFR: 66.68 mL/min (ref >60.00)
Glucose, Bld: 206 mg/dL — ABNORMAL HIGH (ref 70–99)
Potassium: 3.9 mEq/L (ref 3.5–5.1)
Sodium: 140 mEq/L (ref 135–145)
Total Protein: 7.7 g/dL (ref 6.0–8.3)

## 2011-08-03 LAB — CBC WITH DIFFERENTIAL/PLATELET
Basophils Absolute: 0 10*3/uL (ref 0.0–0.1)
Lymphocytes Relative: 36.3 % (ref 12.0–46.0)
Monocytes Relative: 4.9 % (ref 3.0–12.0)
Neutrophils Relative %: 56.3 % (ref 43.0–77.0)
Platelets: 235 10*3/uL (ref 150.0–400.0)
RDW: 13.9 % (ref 11.5–14.6)

## 2011-08-04 ENCOUNTER — Telehealth: Payer: Self-pay | Admitting: *Deleted

## 2011-08-04 NOTE — Telephone Encounter (Signed)
Carollee Herter, can you reschedule this?

## 2011-08-04 NOTE — Telephone Encounter (Signed)
Carollee Herter, Can you reschedule this?

## 2011-08-04 NOTE — Telephone Encounter (Signed)
Spoke with patient this morning and advised her that Dr. Dan Humphreys would still like her to keep referral for ultrasound  .Patient would like to have appt rescheduled.

## 2011-08-07 ENCOUNTER — Encounter: Payer: Self-pay | Admitting: Internal Medicine

## 2011-08-07 ENCOUNTER — Other Ambulatory Visit: Payer: Self-pay | Admitting: *Deleted

## 2011-08-07 MED ORDER — METFORMIN HCL 500 MG PO TABS: 500.0000 mg | ORAL_TABLET | Freq: Two times a day (BID) | ORAL | Status: DC

## 2011-08-07 MED ORDER — GLUCOSE BLOOD VI STRP: ORAL_STRIP | Status: DC

## 2011-08-08 ENCOUNTER — Encounter: Payer: Self-pay | Admitting: Internal Medicine

## 2011-08-08 NOTE — Telephone Encounter (Signed)
I have rescheduled this to 08/09/11, I have left message for patient to return my call so I can advise her of the appointment.

## 2011-08-09 ENCOUNTER — Encounter: Payer: Self-pay | Admitting: Internal Medicine

## 2011-08-09 ENCOUNTER — Other Ambulatory Visit: Payer: Self-pay | Admitting: *Deleted

## 2011-08-09 MED ORDER — GLUCOSE BLOOD VI STRP: ORAL_STRIP | Status: AC

## 2011-08-10 ENCOUNTER — Ambulatory Visit: Payer: Self-pay | Admitting: Internal Medicine

## 2011-08-10 ENCOUNTER — Telehealth: Payer: Self-pay | Admitting: Internal Medicine

## 2011-08-10 DIAGNOSIS — K7581 Nonalcoholic steatohepatitis (NASH): Secondary | ICD-10-CM

## 2011-08-10 NOTE — Telephone Encounter (Signed)
US showed fatty liver

## 2011-08-11 ENCOUNTER — Encounter: Payer: Self-pay | Admitting: Internal Medicine

## 2011-08-21 ENCOUNTER — Encounter: Payer: Self-pay | Admitting: Internal Medicine

## 2011-08-23 ENCOUNTER — Ambulatory Visit: Payer: 59 | Admitting: Internal Medicine

## 2011-08-31 ENCOUNTER — Encounter: Payer: Self-pay | Admitting: Internal Medicine

## 2011-09-06 ENCOUNTER — Ambulatory Visit: Payer: 59 | Admitting: Internal Medicine

## 2011-09-08 ENCOUNTER — Other Ambulatory Visit: Payer: Self-pay | Admitting: Internal Medicine

## 2011-09-08 NOTE — Telephone Encounter (Signed)
Rx called to [facility]

## 2011-09-10 ENCOUNTER — Encounter: Payer: Self-pay | Admitting: Internal Medicine

## 2011-09-11 HISTORY — PX: APPENDECTOMY: SHX54

## 2011-09-11 LAB — COMPREHENSIVE METABOLIC PANEL
Albumin: 4.1 g/dL (ref 3.4–5.0)
Anion Gap: 10 (ref 7–16)
Bilirubin,Total: 0.7 mg/dL (ref 0.2–1.0)
Chloride: 106 mmol/L (ref 98–107)
Creatinine: 0.82 mg/dL (ref 0.60–1.30)
EGFR (African American): >60
SGOT(AST): 37 U/L (ref 15–37)
Sodium: 139 mmol/L (ref 136–145)
Total Protein: 8.3 g/dL — ABNORMAL HIGH (ref 6.4–8.2)

## 2011-09-11 LAB — CBC
MCV: 84 fL (ref 80–100)
RDW: 13.8 % (ref 11.5–14.5)
WBC: 14.2 10*3/uL — ABNORMAL HIGH (ref 3.6–11.0)

## 2011-09-11 LAB — URINALYSIS, COMPLETE
Bilirubin,UR: NEGATIVE
Blood: NEGATIVE
Ketone: NEGATIVE
Leukocyte Esterase: NEGATIVE
Ph: 5 (ref 4.5–8.0)
Squamous Epithelial: <1

## 2011-09-11 LAB — PROTIME-INR: Prothrombin Time: 12.9 secs (ref 11.5–14.7)

## 2011-09-11 LAB — APTT: Activated PTT: 28.8 secs (ref 23.6–35.9)

## 2011-09-12 ENCOUNTER — Observation Stay: Payer: Self-pay | Admitting: Surgery

## 2011-09-12 ENCOUNTER — Telehealth: Payer: Self-pay | Admitting: Internal Medicine

## 2011-09-12 NOTE — Telephone Encounter (Signed)
Pt called to let you know she did have appendicitis She is at [facility]

## 2011-09-13 ENCOUNTER — Ambulatory Visit: Payer: 59 | Admitting: Internal Medicine

## 2011-09-21 ENCOUNTER — Encounter: Payer: Self-pay | Admitting: Internal Medicine

## 2011-09-26 ENCOUNTER — Encounter: Payer: Self-pay | Admitting: Internal Medicine

## 2011-09-26 ENCOUNTER — Other Ambulatory Visit: Payer: Self-pay | Admitting: *Deleted

## 2011-09-26 MED ORDER — FLUTICASONE PROPIONATE 50 MCG/ACT NA SUSP: 1.0000 | Freq: Every day | NASAL | Status: DC

## 2011-10-04 ENCOUNTER — Encounter: Payer: Self-pay | Admitting: Internal Medicine

## 2011-10-06 ENCOUNTER — Encounter: Payer: Self-pay | Admitting: Internal Medicine

## 2011-10-09 ENCOUNTER — Encounter: Payer: Self-pay | Admitting: Internal Medicine

## 2011-10-10 ENCOUNTER — Other Ambulatory Visit (INDEPENDENT_AMBULATORY_CARE_PROVIDER_SITE_OTHER): Payer: 59

## 2011-10-10 ENCOUNTER — Encounter: Payer: Self-pay | Admitting: Internal Medicine

## 2011-10-10 ENCOUNTER — Ambulatory Visit (INDEPENDENT_AMBULATORY_CARE_PROVIDER_SITE_OTHER): Payer: 59 | Admitting: Internal Medicine

## 2011-10-10 ENCOUNTER — Ambulatory Visit: Payer: 59

## 2011-10-10 VITALS — BP 130/80 | HR 70 | Ht 66.5 in | Wt 221.8 lb

## 2011-10-10 DIAGNOSIS — K5900 Constipation, unspecified: Secondary | ICD-10-CM

## 2011-10-10 DIAGNOSIS — K7581 Nonalcoholic steatohepatitis (NASH): Secondary | ICD-10-CM

## 2011-10-10 DIAGNOSIS — R7989 Other specified abnormal findings of blood chemistry: Secondary | ICD-10-CM

## 2011-10-10 DIAGNOSIS — K589 Irritable bowel syndrome without diarrhea: Secondary | ICD-10-CM

## 2011-10-10 DIAGNOSIS — K7689 Other specified diseases of liver: Secondary | ICD-10-CM

## 2011-10-10 DIAGNOSIS — I10 Essential (primary) hypertension: Secondary | ICD-10-CM

## 2011-10-10 LAB — HEPATIC FUNCTION PANEL
AST: 47 U/L — ABNORMAL HIGH (ref 0–37)
Albumin: 4.1 g/dL (ref 3.5–5.2)
Alkaline Phosphatase: 109 U/L (ref 39–117)
Total Bilirubin: 0.5 mg/dL (ref 0.3–1.2)

## 2011-10-10 MED ORDER — LINACLOTIDE 145 MCG PO CAPS: 145.0000 ug | ORAL_CAPSULE | Freq: Every day | ORAL | Status: DC

## 2011-10-10 NOTE — Patient Instructions (Addendum)
Your physician has requested that you go to the basement for lab work before leaving today:   Try to get at least 30 min. Of exercise three times a week.   In November come to our lab here in the basement and have a CBC drawn  Follow up with Dr. Rhea Belton in 3 months

## 2011-10-11 ENCOUNTER — Encounter: Payer: Self-pay | Admitting: Internal Medicine

## 2011-10-11 LAB — FERRITIN: Ferritin: 167.7 ng/mL (ref 10.0–291.0)

## 2011-10-11 LAB — IGG: IgG (Immunoglobin G), Serum: 1280 mg/dL (ref 690–1700)

## 2011-10-11 LAB — HEPATITIS A ANTIBODY, TOTAL: Hep A Total Ab: NEGATIVE

## 2011-10-11 NOTE — Progress Notes (Signed)
Patient ID: Katie Bernard, female   DOB: 1955-08-18, 56 y.o.   MRN: [id number]  SUBJECTIVE: HPI Mrs. Walder is a 56 year old female with a past medical history of attention, diabetes, obesity, IBS with constipation predominance is seen in consultation at the request of Dr. Dan Humphreys for evaluation of elevated transaminases. The patient reports today she is doing well without much complaint. She does have some mild right lower quadrant soreness after a recent appendectomy within the last several months. Otherwise she is without abdominal pain. She does continue to have constipation, only having a bowel movement every 10-14 days. She has tried MiraLAX which seems to help, but she is not using this consistently. She does report some mild nausea in the morning but no vomiting. She's also had heartburn, which is somewhat long-standing for her. Previously this was a daily problem for her, but with her recent weight loss of about 10-15 pounds her heartburn has been better. She denies a history of jaundice, itching, blood in her stool or melena, ascites, or lower extremity edema. She also denies a family history of liver disease. She does not drink alcohol. She is a past tobacco user but none now.  Of note her husband has hepatitis C, but this has been treated in she tells me he is in "remission". She was recently checked for hepatitis C by Dr. Dan Humphreys and negative by antibody.  Review of Systems  As per history of present illness, otherwise negative   Past Medical History  Diagnosis Date  . 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 Hypertension   . Hemorrhoids   . DM (diabetes mellitus)   . Obesity   . Broken arm 2009    left, below elbow  . Broken foot 2009    right  . Menorrhagia   . IBS (irritable bowel syndrome)     Current Outpatient Prescriptions  Medication Sig Dispense Refill  . Aspirin-Salicylamide-Caffeine (BC HEADACHE POWDER PO) Take by mouth as  needed.      . diphenhydramine-acetaminophen (TYLENOL PM) 25-500 MG TABS Take 2 tablets by mouth at bedtime.      . fluticasone (FLONASE) 50 MCG/ACT nasal spray Place 1 spray into the nose daily.  3 g  4  . glucose blood (CONTROL TEST) test strip Use as instructed  100 each  12  . metFORMIN (GLUCOPHAGE) 500 MG tablet Take 1 tablet (500 mg total) by mouth 2 (two) times daily with a meal.  180 tablet  3  . Naproxen Sodium (ALEVE PO) Take by mouth as needed.      . phentermine 37.5 MG capsule TAKE ONE CAPSULE BY MOUTH IN THE MORNING  30 capsule  0  . Linaclotide (LINZESS) 145 MCG CAPS Take 1 capsule (145 mcg total) by mouth daily.  30 capsule  3    Allergies  Allergen Reactions  . Biaxin (Clarithromycin) Itching    Family History  Problem Relation Age of Onset  . Hypertension Father   . Hypertension Sister   . Colon cancer Maternal Aunt   . Breast cancer Maternal Grandmother   . Diabetes      mat. ggm  . Diabetes Sister     History  Substance Use Topics  . Smoking status: Former Smoker    Quit date: 10/26/2001  . Smokeless tobacco: Never Used  . Alcohol Use: No    OBJECTIVE: BP 130/80  Pulse 70  Ht 5' 6.5" (1.689 m)  Wt 221 lb 12.8 oz (100.608 kg)  BMI 35.26 kg/m2 Constitutional: Well-developed and well-nourished. No distress. HEENT: Normocephalic and atraumatic. Oropharynx is clear and moist. No oropharyngeal exudate. Conjunctivae are normal. Pupils are equal round and reactive to light. No scleral icterus. Neck: Neck supple. Trachea midline. Cardiovascular: Normal rate, regular rhythm and intact distal pulses. No M/R/G Pulmonary/chest: Effort normal and breath sounds normal. No wheezing, rales or rhonchi. Abdominal: Soft, obese, mild right lower quadrant tenderness without rebound or guarding, nondistended. Bowel sounds active throughout. There are no masses palpable. No hepatosplenomegaly. Extremities: no clubbing, cyanosis, or edema Lymphadenopathy: No cervical  adenopathy noted. Neurological: Alert and oriented to person place and time. Skin: Skin is warm and dry. No rashes noted. Psychiatric: Normal mood and affect. Behavior is normal.  Labs and Imaging -- Results for RYANE, KONIECZNY (MRN [id number]) as of 10/11/2011 16:30  Ref. Range 10/27/2010 10:40 07/12/2011 13:47 08/02/2011 15:00 10/10/2011 10:25  Alkaline Phosphatase Latest Range: 39-117 U/L 138 (H) 106 101 109  Albumin Latest Range: 3.5-5.2 g/dL 4.0 4.4 4.3 4.1  AST Latest Range: 0-37 U/L 56 (H) 68 (H) 56 (H) 47 (H)  ALT Latest Range: 0-35 U/L 92 (H) 92 (H) 83 (H) 71 (H)  Total Protein Latest Range: 6.0-8.3 g/dL 7.8 7.7 7.7 7.7  Bilirubin, Direct Latest Range: 0.0-0.3 mg/dL    0.0  Total Bilirubin Latest Range: 0.3-1.2 mg/dL 0.6 0.5 0.7 0.5   CBC    Component Value Date/Time   WBC 6.8 08/02/2011 1500   RBC 5.24* 08/02/2011 1500   HGB 14.6 08/02/2011 1500   HCT 43.8 08/02/2011 1500   PLT 235.0 08/02/2011 1500   MCV 83.6 08/02/2011 1500   MCHC 33.2 08/02/2011 1500   RDW 13.9 08/02/2011 1500   LYMPHSABS 2.5 08/02/2011 1500   MONOABS 0.3 08/02/2011 1500   EOSABS 0.2 08/02/2011 1500   BASOSABS 0.0 08/02/2011 1500  Liver ultrasound performed at [facility] 08/10/2011 -- hepatic steatosis, otherwise unremarkable.  ASSESSMENT AND PLAN: 56 year old female with a past medical history of attention, diabetes, obesity, IBS with constipation predominance is seen in consultation at the request of Dr. Dan Humphreys for evaluation of elevated transaminases.  1. Elevated liver transaminases -- the most likely etiology to her mild elevation in serum transaminases is nonalcoholic fatty liver disease/NASH.  We have discussed this today including current treatment which consists of glucose control, weight loss and aerobic exercise. We have reviewed the data on vitamin E, and given her diabetes, I elected not to begin this regimen for now. I have graduated her on her weight loss to this point, and encouraged her to continue her  efforts to lose weight. I feel that she is able to lose an additional 10% of her body weight, her liver enzymes may normalize. I would like to order several additional tests to complete the evaluation of her elevated transaminases, and rule out other possible etiologies including autoimmune hepatitis, etc. we have also discussed liver biopsy in the future to assess the degree of her inflammation and any underlying fibrosis which may already exist. We did discuss how NASH can progress to fibrosis and cirrhosis over time, highlighting the importance of decreasing inflammation now.  I will see her back in 3 months for this issue.  2. IBS-C -- she has had a colonoscopy, and we have requested these results. She was told her colon was normal except for mild melanosis. I recommended a trial of LINZESS 145 mcg daily. We discussed the possible side effect of diarrhea. We  will assess her response at followup, and I've asked that she contact us if she has issues with this medication.  3. CRC screening -- she has had colonoscopy before at [facility], and we'll obtain these records to ensure she is up-to-date and to determine when she will be due for repeat screening.

## 2011-10-12 LAB — ANTI-SMOOTH MUSCLE ANTIBODY, IGG: Smooth Muscle Ab: 17 U (ref <20)

## 2011-11-01 ENCOUNTER — Telehealth: Payer: Self-pay | Admitting: Internal Medicine

## 2011-11-01 NOTE — Telephone Encounter (Signed)
Called [employer] and got nowhere; called pt and spoke with her and she reports she thinks she had these injections years ago at [facility]. Would the labs you drew for Hepatitis show whether she still needs the injections? Thanks.

## 2011-11-02 ENCOUNTER — Ambulatory Visit: Payer: 59

## 2011-11-02 NOTE — Telephone Encounter (Signed)
Hepatitis antibody testing indicates no immunity to A and B, therefore I recommend TwinRx series

## 2011-11-02 NOTE — Telephone Encounter (Signed)
Informed pt of no immunity to Hep A&B and she needs to have the injections. Pt stated understanding and will go today.

## 2011-11-03 ENCOUNTER — Other Ambulatory Visit: Payer: Self-pay | Admitting: Internal Medicine

## 2011-11-03 ENCOUNTER — Ambulatory Visit (INDEPENDENT_AMBULATORY_CARE_PROVIDER_SITE_OTHER): Payer: 59 | Admitting: Internal Medicine

## 2011-11-03 DIAGNOSIS — Z23 Encounter for immunization: Secondary | ICD-10-CM

## 2011-11-03 DIAGNOSIS — K7581 Nonalcoholic steatohepatitis (NASH): Secondary | ICD-10-CM

## 2011-11-03 NOTE — Telephone Encounter (Signed)
Left a message on voicemail at [facility], left message on cell phone voicemail advising patient that she needs a f/u appt before Dr. Dan Humphreys will refill the Phentermine Rx.

## 2011-11-07 ENCOUNTER — Ambulatory Visit: Payer: Self-pay | Admitting: Internal Medicine

## 2011-11-14 ENCOUNTER — Encounter: Payer: Self-pay | Admitting: Internal Medicine

## 2011-11-14 ENCOUNTER — Ambulatory Visit (INDEPENDENT_AMBULATORY_CARE_PROVIDER_SITE_OTHER): Payer: 59 | Admitting: Internal Medicine

## 2011-11-14 VITALS — BP 140/78 | HR 85 | Temp 98.0°F | Ht 67.0 in | Wt 217.0 lb

## 2011-11-14 DIAGNOSIS — K7581 Nonalcoholic steatohepatitis (NASH): Secondary | ICD-10-CM | POA: Insufficient documentation

## 2011-11-14 DIAGNOSIS — E1169 Type 2 diabetes mellitus with other specified complication: Secondary | ICD-10-CM | POA: Insufficient documentation

## 2011-11-14 DIAGNOSIS — K7689 Other specified diseases of liver: Secondary | ICD-10-CM

## 2011-11-14 DIAGNOSIS — I10 Essential (primary) hypertension: Secondary | ICD-10-CM

## 2011-11-14 DIAGNOSIS — E119 Type 2 diabetes mellitus without complications: Secondary | ICD-10-CM

## 2011-11-14 DIAGNOSIS — E669 Obesity, unspecified: Secondary | ICD-10-CM

## 2011-11-14 LAB — COMPREHENSIVE METABOLIC PANEL
Albumin: 4.1 g/dL (ref 3.5–5.2)
Alkaline Phosphatase: 96 U/L (ref 39–117)
BUN: 11 mg/dL (ref 6–23)
CO2: 23 mEq/L (ref 19–32)
Calcium: 9.2 mg/dL (ref 8.4–10.5)
Chloride: 105 mEq/L (ref 96–112)
GFR: 73.9 mL/min (ref >60.00)
Glucose, Bld: 140 mg/dL — ABNORMAL HIGH (ref 70–99)
Potassium: 3.5 mEq/L (ref 3.5–5.1)
Sodium: 137 mEq/L (ref 135–145)
Total Protein: 7.7 g/dL (ref 6.0–8.3)

## 2011-11-14 LAB — HEMOGLOBIN A1C: Hgb A1c MFr Bld: 6.6 % — ABNORMAL HIGH (ref 4.6–6.5)

## 2011-11-14 MED ORDER — PHENTERMINE HCL 37.5 MG PO CAPS: 37.5000 mg | ORAL_CAPSULE | ORAL | Status: DC

## 2011-11-14 MED ORDER — FLUTICASONE PROPIONATE 50 MCG/ACT NA SUSP: 1.0000 | Freq: Every day | NASAL | Status: DC

## 2011-11-14 NOTE — Assessment & Plan Note (Signed)
Patient has not been regularly checking blood sugars, however she does report compliance with metformin. Will check A1c with labs today. Followup one month.

## 2011-11-14 NOTE — Assessment & Plan Note (Signed)
Recent diagnosis of steatohepatitis. Followed by GI physician. Will repeat liver function test today. Encouraged patient and efforts at weight loss. Congratulated her on 3 pound weight loss since her last visit. Plan to followup in one month.

## 2011-11-14 NOTE — Assessment & Plan Note (Signed)
Congratulated patient on 2 pound weight loss. Will plan to continue phentermine to help with appetite suppression. Discussed other modalities of weight loss including gastric bypass. Patient will followup in one month.

## 2011-11-14 NOTE — Progress Notes (Signed)
Subjective:    Patient ID: Katie Bernard, female    DOB: 03-19-1955, 56 y.o.   MRN: [id number]  HPI 56 year old female with recent diagnosis of steatohepatitis and diabetes presents for followup. She reports she is generally been feeling well. She continues to make effort at weight loss with healthy diet and regular physical activity. She has been using phentermine to help with appetite suppression. She has also been looking into other modalities for weight loss including gastric bypass. She has not made any firm decision about this yet.   In regards to diabetes, she has not been checking her blood sugar. However, she reports full compliance with metformin.  Outpatient Encounter Prescriptions as of 11/14/2011  Medication Sig Dispense Refill  . Aspirin-Salicylamide-Caffeine (BC HEADACHE POWDER PO) Take by mouth as needed.      . diphenhydramine-acetaminophen (TYLENOL PM) 25-500 MG TABS Take 2 tablets by mouth at bedtime.      . fluticasone (FLONASE) 50 MCG/ACT nasal spray Place 1 spray into the nose daily.  16 g  4  . metFORMIN (GLUCOPHAGE) 500 MG tablet Take 1 tablet (500 mg total) by mouth 2 (two) times daily with a meal.  180 tablet  3  . Naproxen Sodium (ALEVE PO) Take by mouth as needed.      . phentermine 37.5 MG capsule Take 1 capsule (37.5 mg total) by mouth every morning.  30 capsule  0  . DISCONTD: fluticasone (FLONASE) 50 MCG/ACT nasal spray Place 1 spray into the nose daily.  3 g  4  . DISCONTD: phentermine 37.5 MG capsule TAKE ONE CAPSULE BY MOUTH IN THE MORNING  30 capsule  0  . glucose blood (CONTROL TEST) test strip Use as instructed  100 each  12  . Linaclotide (LINZESS) 145 MCG CAPS Take 1 capsule (145 mcg total) by mouth daily.  30 capsule  3   BP 140/78  Pulse 85  Temp 98 F (36.7 C) (Oral)  Ht 5\' 7"  (1.702 m)  Wt 217 lb (98.431 kg)  BMI 33.99 kg/m2  SpO2 98%  Review of Systems  Constitutional: Negative for fever, chills, appetite change, fatigue and unexpected  weight change.  HENT: Negative for ear pain, congestion, sore throat, trouble swallowing, neck pain, voice change and sinus pressure.   Eyes: Negative for visual disturbance.  Respiratory: Negative for cough, shortness of breath, wheezing and stridor.   Cardiovascular: Negative for chest pain, palpitations and leg swelling.  Gastrointestinal: Negative for nausea, vomiting, abdominal pain, diarrhea, constipation, blood in stool, abdominal distention and anal bleeding.  Genitourinary: Negative for dysuria and flank pain.  Musculoskeletal: Negative for myalgias, arthralgias and gait problem.  Skin: Negative for color change and rash.  Neurological: Negative for dizziness and headaches.  Hematological: Negative for adenopathy. Does not bruise/bleed easily.  Psychiatric/Behavioral: Negative for suicidal ideas, disturbed wake/sleep cycle and dysphoric mood. The patient is not nervous/anxious.        Objective:   Physical Exam  Constitutional: She is oriented to person, place, and time. She appears well-developed and well-nourished. No distress.  HENT:  Head: Normocephalic and atraumatic.  Right Ear: External ear normal.  Left Ear: External ear normal.  Nose: Nose normal.  Mouth/Throat: Oropharynx is clear and moist. No oropharyngeal exudate.  Eyes: Conjunctivae normal are normal. Pupils are equal, round, and reactive to light. Right eye exhibits no discharge. Left eye exhibits no discharge. No scleral icterus.  Neck: Normal range of motion. Neck supple. No tracheal deviation present. No thyromegaly present.  Cardiovascular: Normal rate, regular rhythm, normal heart sounds and intact distal pulses.  Exam reveals no gallop and no friction rub.   No murmur heard. Pulmonary/Chest: Effort normal and breath sounds normal. No respiratory distress. She has no wheezes. She has no rales. She exhibits no tenderness.  Musculoskeletal: Normal range of motion. She exhibits no edema and no tenderness.    Lymphadenopathy:    She has no cervical adenopathy.  Neurological: She is alert and oriented to person, place, and time. No cranial nerve deficit. She exhibits normal muscle tone. Coordination normal.  Skin: Skin is warm and dry. No rash noted. She is not diaphoretic. No erythema. No pallor.  Psychiatric: She has a normal mood and affect. Her behavior is normal. Judgment and thought content normal.          Assessment & Plan:

## 2011-11-14 NOTE — Assessment & Plan Note (Signed)
Blood pressure slightly elevated today, however in general has been well controlled. Will continue to monitor blood pressure at home.

## 2011-11-21 ENCOUNTER — Encounter: Payer: Self-pay | Admitting: Internal Medicine

## 2011-12-05 ENCOUNTER — Ambulatory Visit (INDEPENDENT_AMBULATORY_CARE_PROVIDER_SITE_OTHER): Payer: 59 | Admitting: Internal Medicine

## 2011-12-05 DIAGNOSIS — Z23 Encounter for immunization: Secondary | ICD-10-CM

## 2011-12-19 ENCOUNTER — Encounter: Payer: Self-pay | Admitting: Internal Medicine

## 2011-12-19 ENCOUNTER — Ambulatory Visit (INDEPENDENT_AMBULATORY_CARE_PROVIDER_SITE_OTHER): Payer: 59 | Admitting: Internal Medicine

## 2011-12-19 VITALS — BP 132/80 | HR 71 | Temp 98.1°F | Ht 67.0 in | Wt 214.0 lb

## 2011-12-19 DIAGNOSIS — E669 Obesity, unspecified: Secondary | ICD-10-CM

## 2011-12-19 DIAGNOSIS — H6690 Otitis media, unspecified, unspecified ear: Secondary | ICD-10-CM

## 2011-12-19 DIAGNOSIS — E119 Type 2 diabetes mellitus without complications: Secondary | ICD-10-CM

## 2011-12-19 DIAGNOSIS — H6693 Otitis media, unspecified, bilateral: Secondary | ICD-10-CM | POA: Insufficient documentation

## 2011-12-19 DIAGNOSIS — E1169 Type 2 diabetes mellitus with other specified complication: Secondary | ICD-10-CM

## 2011-12-19 MED ORDER — AMOXICILLIN-POT CLAVULANATE 875-125 MG PO TABS: 1.0000 | ORAL_TABLET | Freq: Two times a day (BID) | ORAL | Status: DC

## 2011-12-19 MED ORDER — PHENTERMINE HCL 37.5 MG PO CAPS: 37.5000 mg | ORAL_CAPSULE | ORAL | Status: DC

## 2011-12-19 MED ORDER — FLUTICASONE PROPIONATE 50 MCG/ACT NA SUSP: 1.0000 | Freq: Every day | NASAL | Status: DC

## 2011-12-19 NOTE — Assessment & Plan Note (Signed)
Symptoms and exam are consistent with otitis media. Will treat with Augmentin twice daily x10 days. Patient will call if symptoms are not improving.

## 2011-12-19 NOTE — Assessment & Plan Note (Signed)
Congratulated patient on another 3 pound weight loss. Will continue phentermine to help with appetite suppression. Followup in one month.

## 2011-12-19 NOTE — Progress Notes (Signed)
Subjective:    Patient ID: Katie Bernard, female    DOB: 24-Feb-1955, 56 y.o.   MRN: [id number]  HPI 56 year old female with history of diabetes, obesity, nonalcoholic steatohepatitis presents for followup. She reports she is generally been feeling well. She reports she continues to limit caloric intake with goal of weight loss. She has lost another 3 pounds since her last visit. She continues to use phentermine to help with appetite suppression. She denies any side effects from this medication. She did not bring a record of her blood sugars today but reports they have been well-controlled. She reports full compliance with metformin. Next  She is concerned today about bilateral ear pain, nasal congestion, and sinus pressure which have been present for approximately 2 weeks. She denies any fever or chills. She is not currently taking any medication for this. She denies any cough, shortness of breath, chest pain.  Outpatient Encounter Prescriptions as of 12/19/2011  Medication Sig Dispense Refill  . amoxicillin-clavulanate (AUGMENTIN) 875-125 MG per tablet Take 1 tablet by mouth 2 (two) times daily.  20 tablet  0  . Aspirin-Salicylamide-Caffeine (BC HEADACHE POWDER PO) Take by mouth as needed.      . diphenhydramine-acetaminophen (TYLENOL PM) 25-500 MG TABS Take 2 tablets by mouth at bedtime.      . fluticasone (FLONASE) 50 MCG/ACT nasal spray Place 1 spray into the nose daily.  16 g  4  . glucose blood (CONTROL TEST) test strip Use as instructed  100 each  12  . Linaclotide (LINZESS) 145 MCG CAPS Take 1 capsule (145 mcg total) by mouth daily.  30 capsule  3  . metFORMIN (GLUCOPHAGE) 500 MG tablet Take 1 tablet (500 mg total) by mouth 2 (two) times daily with a meal.  180 tablet  3  . Naproxen Sodium (ALEVE PO) Take by mouth as needed.      . phentermine 37.5 MG capsule Take 1 capsule (37.5 mg total) by mouth every morning.  30 capsule  1  . [DISCONTINUED] fluticasone (FLONASE) 50 MCG/ACT nasal spray  Place 1 spray into the nose daily.  16 g  4  . [DISCONTINUED] phentermine 37.5 MG capsule Take 1 capsule (37.5 mg total) by mouth every morning.  30 capsule  0   BP 132/80  Pulse 71  Temp 98.1 F (36.7 C) (Oral)  Ht 5\' 7"  (1.702 m)  Wt 214 lb (97.07 kg)  BMI 33.52 kg/m2  SpO2 95%  Review of Systems  Constitutional: Negative for fever, chills, appetite change, fatigue and unexpected weight change.  HENT: Positive for ear pain, congestion, postnasal drip and sinus pressure. Negative for sore throat, trouble swallowing, neck pain and voice change.   Eyes: Negative for visual disturbance.  Respiratory: Negative for cough, shortness of breath, wheezing and stridor.   Cardiovascular: Negative for chest pain, palpitations and leg swelling.  Gastrointestinal: Negative for nausea, vomiting, abdominal pain, diarrhea, constipation, blood in stool, abdominal distention and anal bleeding.  Genitourinary: Negative for dysuria and flank pain.  Musculoskeletal: Negative for myalgias, arthralgias and gait problem.  Skin: Negative for color change and rash.  Neurological: Negative for dizziness and headaches.  Hematological: Negative for adenopathy. Does not bruise/bleed easily.  Psychiatric/Behavioral: Negative for suicidal ideas, sleep disturbance and dysphoric mood. The patient is not nervous/anxious.        Objective:   Physical Exam  Constitutional: She is oriented to person, place, and time. She appears well-developed and well-nourished. No distress.  HENT:  Head: Normocephalic and atraumatic.  Right Ear: External ear normal. Tympanic membrane is bulging. A middle ear effusion is present.  Left Ear: External ear normal. Tympanic membrane is erythematous and bulging. A middle ear effusion is present.  Nose: Nose normal.  Mouth/Throat: Oropharynx is clear and moist. No oropharyngeal exudate.  Eyes: Conjunctivae normal are normal. Pupils are equal, round, and reactive to light. Right eye  exhibits no discharge. Left eye exhibits no discharge. No scleral icterus.  Neck: Normal range of motion. Neck supple. No tracheal deviation present. No thyromegaly present.  Cardiovascular: Normal rate, regular rhythm, normal heart sounds and intact distal pulses.  Exam reveals no gallop and no friction rub.   No murmur heard. Pulmonary/Chest: Effort normal and breath sounds normal. No respiratory distress. She has no wheezes. She has no rales. She exhibits no tenderness.  Musculoskeletal: Normal range of motion. She exhibits no edema and no tenderness.  Lymphadenopathy:    She has no cervical adenopathy.  Neurological: She is alert and oriented to person, place, and time. No cranial nerve deficit. She exhibits normal muscle tone. Coordination normal.  Skin: Skin is warm and dry. No rash noted. She is not diaphoretic. No erythema. No pallor.  Psychiatric: She has a normal mood and affect. Her behavior is normal. Judgment and thought content normal.          Assessment & Plan:

## 2011-12-19 NOTE — Assessment & Plan Note (Addendum)
Will check A1c with labs in 2 months. Continue current medications. Followup in 1 month.

## 2011-12-20 ENCOUNTER — Encounter: Payer: Self-pay | Admitting: Internal Medicine

## 2011-12-21 ENCOUNTER — Ambulatory Visit (INDEPENDENT_AMBULATORY_CARE_PROVIDER_SITE_OTHER): Payer: 59 | Admitting: Internal Medicine

## 2011-12-21 ENCOUNTER — Ambulatory Visit: Payer: 59 | Admitting: Internal Medicine

## 2011-12-21 ENCOUNTER — Encounter: Payer: Self-pay | Admitting: Internal Medicine

## 2011-12-21 VITALS — BP 132/80 | HR 72 | Ht 67.0 in | Wt 215.2 lb

## 2011-12-21 DIAGNOSIS — K7581 Nonalcoholic steatohepatitis (NASH): Secondary | ICD-10-CM

## 2011-12-21 DIAGNOSIS — K5900 Constipation, unspecified: Secondary | ICD-10-CM

## 2011-12-21 DIAGNOSIS — K7689 Other specified diseases of liver: Secondary | ICD-10-CM

## 2011-12-21 MED ORDER — LINACLOTIDE 290 MCG PO CAPS: 1.0000 | ORAL_CAPSULE | Freq: Every day | ORAL | Status: DC

## 2011-12-21 NOTE — Patient Instructions (Addendum)
You have been given samples of Linzess 290 mcg if it doesn't work try Amitiza 24 mg daily.  Follow up with dr. Rhea Belton in 3 months (february 2014)  Before your scheduled office visit please stop at the lab for some blood work.

## 2011-12-21 NOTE — Progress Notes (Signed)
Subjective:    Patient ID: Katie Bernard, female    DOB: 04-23-1955, 56 y.o.   MRN: [id number]  HPI Mrs. Pulsifer is a 56 yo female with PMH of diabetes, obesity, and chronic constipation, and nonalcoholic steatohepatitis who seen in followup. She is alone today.  She was last seen approximately 3 months ago and reports she is doing well. She continues to struggle with ongoing constipation, having a bowel movement as infrequently as once every one to 2 weeks. She tried LINZESS 145 mcg daily after her last appointment and this works very well for a few days, but seemed to lose its efficacy. She does discontinue this medication. She is not currently using any other laxatives. We had requested her previous colonoscopy records from [facility] but we never received these.  From a liver standpoint she reports she is doing well. No abdominal swelling, lower extremity swelling, jaundice, itching, bleeding. She has continued to try to lose weight and remains on phentermine. She has lost approximately 3 or 4 pounds since last visit. She is not on a exercise regimen formally, but tries to park further away from her destinations to increase her walking and exercise. She is using occasional Tylenol and asks if this is okay.   Review of Systems As per HPI, otherwise negative   Current Medications, Allergies, Past Medical History, Past Surgical History, Family History and Social History were reviewed in [employer] record.     Objective:   Physical Exam BP 132/80  Pulse 72  Ht 5\' 7"  (1.702 m)  Wt 215 lb 4 oz (97.637 kg)  BMI 33.71 kg/m2 Constitutional: Well-developed and well-nourished. No distress. HEENT: Normocephalic and atraumatic. Oropharynx is clear and moist. No oropharyngeal exudate. Conjunctivae are normal.  No scleral icterus. Cardiovascular: Normal rate, regular rhythm and intact distal pulses. No M/R/G Pulmonary/chest: Effort normal and breath sounds normal. No wheezing,  rales or rhonchi. Abdominal: Soft, obese, nontender, nondistended. Bowel sounds active throughout. No hepatosplenomegaly. Extremities: no clubbing, cyanosis, or edema Neurological: Alert and oriented to person place and time. Skin: Skin is warm and dry. No rashes noted. Psychiatric: Normal mood and affect. Behavior is normal.  CMP     Component Value Date/Time   NA 137 11/14/2011 1018   K 3.5 11/14/2011 1018   CL 105 11/14/2011 1018   CO2 23 11/14/2011 1018   GLUCOSE 140* 11/14/2011 1018   BUN 11 11/14/2011 1018   CREATININE 0.9 11/14/2011 1018   CALCIUM 9.2 11/14/2011 1018   PROT 7.7 11/14/2011 1018   ALBUMIN 4.1 11/14/2011 1018   AST 50* 11/14/2011 1018   ALT 75* 11/14/2011 1018   ALKPHOS 96 11/14/2011 1018   BILITOT 0.6 11/14/2011 1018   CBC    Component Value Date/Time   WBC 6.8 08/02/2011 1500   RBC 5.24* 08/02/2011 1500   HGB 14.6 08/02/2011 1500   HCT 43.8 08/02/2011 1500   PLT 235.0 08/02/2011 1500   MCV 83.6 08/02/2011 1500   MCHC 33.2 08/02/2011 1500   RDW 13.9 08/02/2011 1500   LYMPHSABS 2.5 08/02/2011 1500   MONOABS 0.3 08/02/2011 1500   EOSABS 0.2 08/02/2011 1500   BASOSABS 0.0 08/02/2011 1500   Results for LOLA, COBLE (MRN [id number]) as of 12/21/2011 12:33  Ref. Range 10/27/2010 10:40 07/12/2011 13:47 08/02/2011 15:00 10/10/2011 10:25 10/10/2011 17:14 11/14/2011 10:18  AST Latest Range: 0-37 U/L 56 (H) 68 (H) 56 (H) 47 (H)  50 (H)  ALT Latest Range: 0-35 U/L 92 (H) 92 (  H) 83 (H) 71 (H)  75 (H)  Total Protein Latest Range: 6.0-8.3 g/dL 7.8 7.7 7.7 7.7  7.7  Bilirubin, Direct Latest Range: 0.0-0.3 mg/dL    0.0    Total Bilirubin Latest Range: 0.3-1.2 mg/dL 0.6 0.5 0.7 0.5  0.6   Abdominal ultrasound reviewed from 07/2011 which revealed hepatic steatosis and a normal gallbladder     Assessment & Plan:  56 yo female with PMH of diabetes, obesity, and chronic constipation, and nonalcoholic steatohepatitis who seen in followup.  1.  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 Her prior workup including ultrasound, serologies, and other blood tests were unrevealing for another cause of liver inflammation. This makes nonalcoholic steatohepatitis the most likely cause of her transaminitis. She has been able to lose some weight, and I have strongly encouraged her to continue working to lose weight. I expect if she was able to lose 10% of her body weight, her liver enzymes may normalize. We again discussed the risk of long-term liver inflammation and progression to scarring and even cirrhosis. We discussed possible liver biopsy in the future to determine the level of fibrosis or scarring. For now she will continue to work towards weight reduction and increasing her physical activity. It is okay if she uses Tylenol on a daily basis, but that she limit this to 2 g daily if possible. I would like to see her back in 3 months with repeat liver tests that day.  2.  Constipation -- chronic issue for her and we will try the higher dose of LINZESS. LINZESS 290 mcg daily. If this fails to work consistently for her, then we will try lubiprostone and 24 mcg twice a day.  3.  CRC screening -- per her recollection her first colonoscopy was normal other than melanosis. I would like to obtain these records so that we can be sure we perform repeat screening colonoscopy at the appropriate time interval.

## 2011-12-22 ENCOUNTER — Encounter: Payer: Self-pay | Admitting: Internal Medicine

## 2011-12-22 ENCOUNTER — Other Ambulatory Visit: Payer: Self-pay | Admitting: *Deleted

## 2011-12-22 ENCOUNTER — Other Ambulatory Visit: Payer: Self-pay | Admitting: Internal Medicine

## 2011-12-22 DIAGNOSIS — E669 Obesity, unspecified: Secondary | ICD-10-CM

## 2011-12-22 MED ORDER — PHENTERMINE HCL 37.5 MG PO CAPS: 37.5000 mg | ORAL_CAPSULE | ORAL | Status: DC

## 2011-12-22 NOTE — Telephone Encounter (Signed)
E-script Rx denied, Rx called to pharmacy earlier today.

## 2011-12-22 NOTE — Telephone Encounter (Signed)
Rx called to [facility], patient notified via my chart.

## 2012-01-02 ENCOUNTER — Encounter: Payer: Self-pay | Admitting: Internal Medicine

## 2012-01-18 ENCOUNTER — Encounter: Payer: Self-pay | Admitting: Internal Medicine

## 2012-01-18 ENCOUNTER — Ambulatory Visit (INDEPENDENT_AMBULATORY_CARE_PROVIDER_SITE_OTHER): Payer: 59 | Admitting: Internal Medicine

## 2012-01-18 VITALS — BP 130/80 | HR 82 | Temp 98.4°F | Resp 16 | Wt 211.8 lb

## 2012-01-18 DIAGNOSIS — K7689 Other specified diseases of liver: Secondary | ICD-10-CM

## 2012-01-18 DIAGNOSIS — H6690 Otitis media, unspecified, unspecified ear: Secondary | ICD-10-CM

## 2012-01-18 DIAGNOSIS — Z1331 Encounter for screening for depression: Secondary | ICD-10-CM

## 2012-01-18 DIAGNOSIS — E669 Obesity, unspecified: Secondary | ICD-10-CM

## 2012-01-18 DIAGNOSIS — E1169 Type 2 diabetes mellitus with other specified complication: Secondary | ICD-10-CM

## 2012-01-18 DIAGNOSIS — E119 Type 2 diabetes mellitus without complications: Secondary | ICD-10-CM

## 2012-01-18 DIAGNOSIS — K7581 Nonalcoholic steatohepatitis (NASH): Secondary | ICD-10-CM

## 2012-01-18 DIAGNOSIS — H6693 Otitis media, unspecified, bilateral: Secondary | ICD-10-CM

## 2012-01-18 MED ORDER — PHENTERMINE HCL 37.5 MG PO CAPS: 37.5000 mg | ORAL_CAPSULE | ORAL | Status: DC

## 2012-01-18 MED ORDER — AMOXICILLIN-POT CLAVULANATE 875-125 MG PO TABS: 1.0000 | ORAL_TABLET | Freq: Two times a day (BID) | ORAL | Status: DC

## 2012-01-18 MED ORDER — FLUTICASONE PROPIONATE 50 MCG/ACT NA SUSP: 1.0000 | Freq: Every day | NASAL | Status: AC

## 2012-01-18 NOTE — Assessment & Plan Note (Signed)
Patient is not regularly check blood sugars. Will check A1c with labs today. Encourage continued efforts at healthy diet. Continue metformin. Followup in 2 months.

## 2012-01-18 NOTE — Assessment & Plan Note (Signed)
Patient with history of bilateral otitis media status post 10 days of Augmentin. Right ear has improved considerably but left ear shows persistent inflammation and purulent effusion. Will treat with another 10 days of Augmentin. We discussed that if symptoms do not resolve, we'll need to set up ENT evaluation.

## 2012-01-18 NOTE — Progress Notes (Signed)
Subjective:    Patient ID: Katie Bernard, female    DOB: Nov 01, 1955, 56 y.o.   MRN: [id number]  HPI 56 year old female with history of diabetes, obesity, nonalcoholic steatohepatitis, and recent episode of bilateral otitis media presents for followup. In regards to recent ear infection, she reports that symptoms initially improved after treatment with Augmentin. She is subsequently developed some recurrence of sinus congestion, sinus pressure, and bilateral ear pain. She denies hearing loss. She denies fever or chills. She reports that she had extensive evaluation by ENT in the past including evaluation for a deviated septum. She has history of recurrent sinus infections.  In regards to diabetes, she reports she has not checked her blood sugar. She does report full compliance with metformin. She has also been diligent about eating a healthy diet and getting regular physical activity. She has lost another 3 pounds since her last visit. She continues on phentermine to help with appetite suppression. She denies any side effects from this medication.  Outpatient Encounter Prescriptions as of 01/18/2012  Medication Sig Dispense Refill  . Aspirin-Salicylamide-Caffeine (BC HEADACHE POWDER PO) Take by mouth as needed.      . diphenhydramine-acetaminophen (TYLENOL PM) 25-500 MG TABS Take 2 tablets by mouth at bedtime.      . fluticasone (FLONASE) 50 MCG/ACT nasal spray Place 1 spray into the nose daily.  48 g  4  . glucose blood (CONTROL TEST) test strip Use as instructed  100 each  12  . Linaclotide (LINZESS) 290 MCG CAPS Take 1 capsule by mouth daily.  30 capsule  1  . metFORMIN (GLUCOPHAGE) 500 MG tablet Take 1 tablet (500 mg total) by mouth 2 (two) times daily with a meal.  180 tablet  3  . Naproxen Sodium (ALEVE PO) Take by mouth as needed.      . phentermine 37.5 MG capsule Take 1 capsule (37.5 mg total) by mouth every morning.  30 capsule  1  . amoxicillin-clavulanate (AUGMENTIN) 875-125 MG per  tablet Take 1 tablet by mouth 2 (two) times daily.  20 tablet  0   BP 130/80  Pulse 82  Temp 98.4 F (36.9 C) (Oral)  Resp 16  Wt 211 lb 12 oz (96.049 kg)  Review of Systems  Constitutional: Negative for fever, chills, appetite change, fatigue and unexpected weight change.  HENT: Positive for ear pain, congestion and sinus pressure. Negative for sore throat, trouble swallowing, neck pain and voice change.   Eyes: Negative for visual disturbance.  Respiratory: Negative for cough, shortness of breath, wheezing and stridor.   Cardiovascular: Negative for chest pain, palpitations and leg swelling.  Gastrointestinal: Negative for nausea, vomiting, abdominal pain, diarrhea, constipation, blood in stool, abdominal distention and anal bleeding.  Genitourinary: Negative for dysuria and flank pain.  Musculoskeletal: Negative for myalgias, arthralgias and gait problem.  Skin: Negative for color change and rash.  Neurological: Negative for dizziness and headaches.  Hematological: Negative for adenopathy. Does not bruise/bleed easily.  Psychiatric/Behavioral: Negative for suicidal ideas, sleep disturbance and dysphoric mood. The patient is not nervous/anxious.        Objective:   Physical Exam  Constitutional: She is oriented to person, place, and time. She appears well-developed and well-nourished. No distress.  HENT:  Head: Normocephalic and atraumatic.  Right Ear: External ear normal. Tympanic membrane is not erythematous and not bulging. A middle ear effusion is present.  Left Ear: External ear normal. Tympanic membrane is erythematous and bulging. A middle ear effusion is present.  Nose:  Nose normal.  Mouth/Throat: Oropharynx is clear and moist. No oropharyngeal exudate.  Eyes: Conjunctivae normal are normal. Pupils are equal, round, and reactive to light. Right eye exhibits no discharge. Left eye exhibits no discharge. No scleral icterus.  Neck: Normal range of motion. Neck supple. No  tracheal deviation present. No thyromegaly present.  Cardiovascular: Normal rate, regular rhythm, normal heart sounds and intact distal pulses.  Exam reveals no gallop and no friction rub.   No murmur heard. Pulmonary/Chest: Effort normal and breath sounds normal. No respiratory distress. She has no wheezes. She has no rales. She exhibits no tenderness.  Musculoskeletal: Normal range of motion. She exhibits no edema and no tenderness.  Lymphadenopathy:    She has no cervical adenopathy.  Neurological: She is alert and oriented to person, place, and time. No cranial nerve deficit. She exhibits normal muscle tone. Coordination normal.  Skin: Skin is warm and dry. No rash noted. She is not diaphoretic. No erythema. No pallor.  Psychiatric: She has a normal mood and affect. Her behavior is normal. Judgment and thought content normal.          Assessment & Plan:

## 2012-01-18 NOTE — Assessment & Plan Note (Signed)
Congratulated patient on another 3 pound weight loss. Will continue phentermine to help with appetite suppression. Encouraged her to continue efforts at healthy diet and regular physical activity. Followup in one to 2 months.

## 2012-01-18 NOTE — Assessment & Plan Note (Signed)
Encouraged patient to continue efforts at weight loss. Will recheck liver function tests with labs today.

## 2012-01-19 LAB — HEMOGLOBIN A1C: Hgb A1c MFr Bld: 6.7 % — ABNORMAL HIGH (ref 4.6–6.5)

## 2012-03-21 ENCOUNTER — Ambulatory Visit (INDEPENDENT_AMBULATORY_CARE_PROVIDER_SITE_OTHER): Payer: 59 | Admitting: Internal Medicine

## 2012-03-21 ENCOUNTER — Encounter: Payer: Self-pay | Admitting: Internal Medicine

## 2012-03-21 VITALS — BP 132/84 | HR 92 | Temp 98.2°F | Wt 210.0 lb

## 2012-03-21 DIAGNOSIS — J320 Chronic maxillary sinusitis: Secondary | ICD-10-CM

## 2012-03-21 DIAGNOSIS — I10 Essential (primary) hypertension: Secondary | ICD-10-CM

## 2012-03-21 DIAGNOSIS — E785 Hyperlipidemia, unspecified: Secondary | ICD-10-CM

## 2012-03-21 DIAGNOSIS — E669 Obesity, unspecified: Secondary | ICD-10-CM

## 2012-03-21 DIAGNOSIS — E119 Type 2 diabetes mellitus without complications: Secondary | ICD-10-CM

## 2012-03-21 MED ORDER — LEVOFLOXACIN 500 MG PO TABS: 500.0000 mg | ORAL_TABLET | Freq: Every day | ORAL | Status: DC

## 2012-03-21 NOTE — Assessment & Plan Note (Signed)
BG have been well controlled per pt report. Will plan to recheck A1c with labs in 1 month. Continue current medications.

## 2012-03-21 NOTE — Progress Notes (Signed)
Subjective:    Patient ID: Katie Bernard, female    DOB: January 20, 1956, 57 y.o.   MRN: [id number]  HPI 57 year old female with history of diabetes, hypertension, obesity, nonalcoholic steatohepatitis presents for followup. She reports she is generally doing well. She has lost another pound. She reports compliance with her medications. She has been checking her blood pressure intermittently and reports it has been well-controlled. She reports sugars have been well-controlled. Her only concern today is bilateral sinus congestion and ear pain. She denies any fever or chills. She denies cough or shortness of breath. She completed a course of Augmentin in January with minimal improvement in her symptoms. 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 Aspirin-Salicylamide-Caffeine (BC HEADACHE POWDER PO) Take by mouth as needed.      . diphenhydramine-acetaminophen (TYLENOL PM) 25-500 MG TABS Take 2 tablets by mouth at bedtime.      . fluticasone (FLONASE) 50 MCG/ACT nasal spray Place 1 spray into the nose daily.  48 g  4  . glucose blood (CONTROL TEST) test strip Use as instructed  100 each  12  . metFORMIN (GLUCOPHAGE) 500 MG tablet Take 1 tablet (500 mg total) by mouth 2 (two) times daily with a meal.  180 tablet  3  . Naproxen Sodium (ALEVE PO) Take by mouth as needed.      . phentermine 37.5 MG capsule Take 1 capsule (37.5 mg total) by mouth every morning.  30 capsule  1  . Linaclotide (LINZESS) 290 MCG CAPS Take 1 capsule by mouth daily.  30 capsule  1  . [DISCONTINUED] amoxicillin-clavulanate (AUGMENTIN) 875-125 MG per tablet Take 1 tablet by mouth 2 (two) times daily.  20 tablet  0   Last reviewed on 03/21/2012  4:07 PM by Shelia Media, MD  BP 132/84  Pulse 92  Temp 98.2 F (36.8 C) (Oral)  Wt 210 lb (95.255 kg)   SpO2 97%  Review of Systems  Constitutional: Negative for fever, chills, appetite change, fatigue and unexpected weight change.  HENT: Positive for ear pain, congestion and sinus pressure. Negative for sore throat, trouble swallowing, neck pain and voice change.   Eyes: Negative for visual disturbance.  Respiratory: Negative for cough, shortness of breath, wheezing and stridor.   Cardiovascular: Negative for chest pain, palpitations and leg swelling.  Gastrointestinal: Negative for nausea, vomiting, abdominal pain, diarrhea, constipation, blood in stool, abdominal distention and anal bleeding.  Genitourinary: Negative for dysuria and flank pain.  Musculoskeletal: Negative for myalgias, arthralgias and gait problem.  Skin: Negative for color change and rash.  Neurological: Negative for dizziness and headaches.  Hematological: Negative for adenopathy. Does not bruise/bleed easily.  Psychiatric/Behavioral: Negative for suicidal ideas, sleep disturbance and dysphoric mood. The patient is not nervous/anxious.        Objective:   Physical Exam  Constitutional: She is oriented to person, place, and time. She appears well-developed and well-nourished. No distress.  HENT:  Head: Normocephalic and atraumatic.  Right Ear: External ear normal. Tympanic membrane is bulging. Tympanic membrane is not erythematous. A middle ear effusion is present.  Left Ear: External ear normal. Tympanic membrane is erythematous and bulging. A middle ear effusion is present.  Nose: Mucosal edema present.  Mouth/Throat: Oropharynx is clear and moist. No oropharyngeal exudate.  Eyes: Conjunctivae normal are normal. Pupils  are equal, round, and reactive to light. Right eye exhibits no discharge. Left eye exhibits no discharge. No scleral icterus.  Neck: Normal range of motion. Neck supple. No tracheal deviation present. No thyromegaly present.  Cardiovascular: Normal rate, regular rhythm, normal heart sounds and intact  distal pulses.  Exam reveals no gallop and no friction rub.   No murmur heard. Pulmonary/Chest: Effort normal and breath sounds normal. No respiratory distress. She has no wheezes. She has no rales. She exhibits no tenderness.  Musculoskeletal: Normal range of motion. She exhibits no edema and no tenderness.  Lymphadenopathy:    She has no cervical adenopathy.  Neurological: She is alert and oriented to person, place, and time. No cranial nerve deficit. She exhibits normal muscle tone. Coordination normal.  Skin: Skin is warm and dry. No rash noted. She is not diaphoretic. No erythema. No pallor.  Psychiatric: She has a normal mood and affect. Her behavior is normal. Judgment and thought content normal.          Assessment & Plan:

## 2012-03-21 NOTE — Assessment & Plan Note (Signed)
Will check lipids and LFTs with labs. 

## 2012-03-21 NOTE — Assessment & Plan Note (Signed)
Symptoms and exam c/w left otitis media and bilateral maxillary sinusitis. No improvement with augmentin. Will treat with levaquin. If no improvement, would favor repeat evaluation by ENT.

## 2012-03-21 NOTE — Assessment & Plan Note (Signed)
Congratulated pt on another 1lb weight loss. Encouraged her to keep a food diary and increase physical activity. Continue phentermine for appetite suppression. Follow up 1 month and prn.

## 2012-03-21 NOTE — Assessment & Plan Note (Signed)
BP Readings from Last 3 Encounters:  03/21/12 132/84  01/18/12 130/80  12/21/11 132/80   BP generally well controlled. Will monitor closely while on phentermine.

## 2012-03-22 LAB — COMPREHENSIVE METABOLIC PANEL
ALT: 42 U/L — ABNORMAL HIGH (ref 0–35)
AST: 32 U/L (ref 0–37)
Albumin: 4.2 g/dL (ref 3.5–5.2)
Alkaline Phosphatase: 100 U/L (ref 39–117)
BUN: 13 mg/dL (ref 6–23)
Calcium: 9.5 mg/dL (ref 8.4–10.5)
Chloride: 105 mEq/L (ref 96–112)
Potassium: 4.2 mEq/L (ref 3.5–5.1)

## 2012-03-22 LAB — LIPID PANEL
HDL: 52.6 mg/dL (ref >39.00)
Total CHOL/HDL Ratio: 4
Triglycerides: 145 mg/dL (ref 0.0–149.0)

## 2012-03-30 ENCOUNTER — Other Ambulatory Visit: Payer: Self-pay

## 2012-04-25 ENCOUNTER — Encounter: Payer: Self-pay | Admitting: Internal Medicine

## 2012-04-25 ENCOUNTER — Ambulatory Visit (INDEPENDENT_AMBULATORY_CARE_PROVIDER_SITE_OTHER): Payer: 59 | Admitting: Internal Medicine

## 2012-04-25 DIAGNOSIS — E669 Obesity, unspecified: Secondary | ICD-10-CM

## 2012-04-25 DIAGNOSIS — H6690 Otitis media, unspecified, unspecified ear: Secondary | ICD-10-CM | POA: Insufficient documentation

## 2012-04-25 MED ORDER — LISINOPRIL 10 MG PO TABS: 10.0000 mg | ORAL_TABLET | Freq: Every day | ORAL | Status: AC

## 2012-04-25 NOTE — Assessment & Plan Note (Signed)
Persistent bilateral OM despite several courses of antibiotics, most recently Levaquin. Question if pt needs TM tubes. Will set up ENT evaluation.

## 2012-04-25 NOTE — Progress Notes (Signed)
Subjective:    Patient ID: Katie Bernard, female    DOB: [birth year], 57 y.o.   MRN: [id number]  HPI 57 year old female with history of DM, obesity, and chronic ear infections presents for follow up. She reports persistent pain in her right ear. She denies any fever or chills. She reports some nasal congestion. She denies cough or shortness of breath.  She notes that her blood pressure has been elevated at home, typically over 140/90. She denies any headache, chest pain, palpitations.  She continues to take phentermine to help with appetite suppression, but notes some dietary indiscretion recently.  Outpatient Encounter Prescriptions as of 04/25/2012  Medication Sig Dispense Refill  . diphenhydramine-acetaminophen (TYLENOL PM) 25-500 MG TABS Take 2 tablets by mouth at bedtime.      . fluticasone (FLONASE) 50 MCG/ACT nasal spray Place 1 spray into the nose daily.  48 g  4  . glucose blood (CONTROL TEST) test strip Use as instructed  100 each  12  . metFORMIN (GLUCOPHAGE) 500 MG tablet Take 1 tablet (500 mg total) by mouth 2 (two) times daily with a meal.  180 tablet  3  . Multiple Vitamins-Minerals (CENTRUM SILVER ULTRA WOMENS PO) Take by mouth.      . [DISCONTINUED] Aspirin-Salicylamide-Caffeine (BC HEADACHE POWDER PO) Take by mouth as needed.      . [DISCONTINUED] Naproxen Sodium (ALEVE PO) Take by mouth as needed.      . [DISCONTINUED] phentermine 37.5 MG capsule Take 1 capsule (37.5 mg total) by mouth every morning.  30 capsule  1  . Linaclotide (LINZESS) 290 MCG CAPS Take 1 capsule by mouth daily.  30 capsule  1  . lisinopril (PRINIVIL,ZESTRIL) 10 MG tablet Take 1 tablet (10 mg total) by mouth daily.  90 tablet  3  . [DISCONTINUED] levofloxacin (LEVAQUIN) 500 MG tablet Take 1 tablet (500 mg total) by mouth daily.  7 tablet  0   No facility-administered encounter medications on file as of 04/25/2012.   BP 140/96  Pulse 80  Temp(Src) 98.4 F (36.9 C) (Oral)  Wt 213 lb (96.616 kg)  BMI  33.35 kg/m2  SpO2 97%  Review of Systems  Constitutional: Negative for fever, chills, appetite change, fatigue and unexpected weight change.  HENT: Positive for ear pain. Negative for congestion, sore throat, trouble swallowing, neck pain, voice change and sinus pressure.   Eyes: Negative for visual disturbance.  Respiratory: Negative for cough, shortness of breath, wheezing and stridor.   Cardiovascular: Negative for chest pain, palpitations and leg swelling.  Gastrointestinal: Negative for nausea, vomiting, abdominal pain, diarrhea, constipation, blood in stool, abdominal distention and anal bleeding.  Genitourinary: Negative for dysuria and flank pain.  Musculoskeletal: Negative for myalgias, arthralgias and gait problem.  Skin: Negative for color change and rash.  Neurological: Negative for dizziness and headaches.  Hematological: Negative for adenopathy. Does not bruise/bleed easily.  Psychiatric/Behavioral: Negative for suicidal ideas, sleep disturbance and dysphoric mood. The patient is not nervous/anxious.        Objective:   Physical Exam  Constitutional: She is oriented to person, place, and time. She appears well-developed and well-nourished. No distress.  HENT:  Head: Normocephalic and atraumatic.  Right Ear: External ear normal. Tympanic membrane is erythematous. A middle ear effusion is present.  Left Ear: External ear normal. Tympanic membrane is erythematous and bulging. A middle ear effusion is present.  Nose: Nose normal.  Mouth/Throat: Oropharynx is clear and moist. No oropharyngeal exudate.  Eyes: Conjunctivae are normal. Pupils are  equal, round, and reactive to light. Right eye exhibits no discharge. Left eye exhibits no discharge. No scleral icterus.  Neck: Normal range of motion. Neck supple. No tracheal deviation present. No thyromegaly present.  Cardiovascular: Normal rate, regular rhythm, normal heart sounds and intact distal pulses.  Exam reveals no gallop and  no friction rub.   No murmur heard. Pulmonary/Chest: Effort normal and breath sounds normal. No accessory muscle usage. Not tachypneic. No respiratory distress. She has no decreased breath sounds. She has no wheezes. She has no rhonchi. She has no rales. She exhibits no tenderness.  Musculoskeletal: Normal range of motion. She exhibits no edema and no tenderness.  Lymphadenopathy:    She has no cervical adenopathy.  Neurological: She is alert and oriented to person, place, and time. No cranial nerve deficit. She exhibits normal muscle tone. Coordination normal.  Skin: Skin is warm and dry. No rash noted. She is not diaphoretic. No erythema. No pallor.  Psychiatric: She has a normal mood and affect. Her behavior is normal. Judgment and thought content normal.          Assessment & Plan:

## 2012-04-25 NOTE — Assessment & Plan Note (Signed)
Wt Readings from Last 3 Encounters:  04/25/12 213 lb (96.616 kg)  03/21/12 210 lb (95.255 kg)  01/18/12 211 lb 12 oz (96.049 kg)    Weight up 3lbs today. BP elevated. Will stop phentermine. Encouraged continued efforts at healthy diet and regular physical activity.

## 2012-04-25 NOTE — Assessment & Plan Note (Signed)
BP Readings from Last 3 Encounters:  04/25/12 140/96  03/21/12 132/84  01/18/12 130/80   BP elevated today and pt reports has been elevated at home. Will start lisinopril 10mg  daily. Pt will have Cr and K checked in 1 week. Follow up 1 month.

## 2012-04-30 ENCOUNTER — Other Ambulatory Visit: Payer: 59

## 2012-05-07 ENCOUNTER — Other Ambulatory Visit (INDEPENDENT_AMBULATORY_CARE_PROVIDER_SITE_OTHER): Payer: 59

## 2012-05-07 ENCOUNTER — Encounter: Payer: Self-pay | Admitting: Internal Medicine

## 2012-05-07 DIAGNOSIS — E669 Obesity, unspecified: Secondary | ICD-10-CM

## 2012-05-07 DIAGNOSIS — E119 Type 2 diabetes mellitus without complications: Secondary | ICD-10-CM

## 2012-05-07 LAB — COMPREHENSIVE METABOLIC PANEL
Albumin: 4 g/dL (ref 3.5–5.2)
BUN: 19 mg/dL (ref 6–23)
Calcium: 9.1 mg/dL (ref 8.4–10.5)
Chloride: 103 mEq/L (ref 96–112)
Creatinine, Ser: 0.8 mg/dL (ref 0.4–1.2)
GFR: 83.93 mL/min (ref >60.00)
Glucose, Bld: 134 mg/dL — ABNORMAL HIGH (ref 70–99)
Potassium: 3.8 mEq/L (ref 3.5–5.1)

## 2012-05-07 LAB — HEMOGLOBIN A1C: Hgb A1c MFr Bld: 6.5 % (ref 4.6–6.5)

## 2012-05-09 ENCOUNTER — Ambulatory Visit: Payer: 59 | Admitting: Adult Health

## 2012-05-09 ENCOUNTER — Encounter: Payer: Self-pay | Admitting: Adult Health

## 2012-05-09 ENCOUNTER — Ambulatory Visit (INDEPENDENT_AMBULATORY_CARE_PROVIDER_SITE_OTHER): Payer: 59 | Admitting: Adult Health

## 2012-05-09 VITALS — BP 143/91 | HR 73 | Temp 98.3°F | Resp 14 | Ht 67.5 in | Wt 213.0 lb

## 2012-05-09 DIAGNOSIS — H6690 Otitis media, unspecified, unspecified ear: Secondary | ICD-10-CM

## 2012-05-09 DIAGNOSIS — J020 Streptococcal pharyngitis: Secondary | ICD-10-CM

## 2012-05-09 DIAGNOSIS — H6693 Otitis media, unspecified, bilateral: Secondary | ICD-10-CM

## 2012-05-09 DIAGNOSIS — J029 Acute pharyngitis, unspecified: Secondary | ICD-10-CM | POA: Insufficient documentation

## 2012-05-09 LAB — POCT RAPID STREP A (OFFICE): Rapid Strep A Screen: NEGATIVE

## 2012-05-09 MED ORDER — AMOXICILLIN-POT CLAVULANATE 875-125 MG PO TABS: 1.0000 | ORAL_TABLET | Freq: Two times a day (BID) | ORAL | Status: DC

## 2012-05-09 NOTE — Assessment & Plan Note (Signed)
Rapid strep was negative. However, given the patient's severe discomfort of sore throat, I will start Augmentin. She may also use Chloraseptic spray as needed and gargling with salt water solution may also help. Tylenol or ibuprofen for pain, fever

## 2012-05-09 NOTE — Progress Notes (Signed)
  Subjective:    Patient ID: Katie Bernard, female    DOB: June 11, 1955, 57 y.o.   MRN: [id number]  HPI  Patient is a 57 year old female who presents to clinic with complaints of a severe sore throat. Two children in the home are currently being treated for strep. She denies fever but has experienced chills and general body discomfort. She has not taken anything over-the-counter.   Current Outpatient Prescriptions on File Prior to Visit  Medication Sig Dispense Refill  . diphenhydramine-acetaminophen (TYLENOL PM) 25-500 MG TABS Take 2 tablets by mouth at bedtime.      . fluticasone (FLONASE) 50 MCG/ACT nasal spray Place 1 spray into the nose daily.  48 g  4  . glucose blood (CONTROL TEST) test strip Use as instructed  100 each  12  . lisinopril (PRINIVIL,ZESTRIL) 10 MG tablet Take 1 tablet (10 mg total) by mouth daily.  90 tablet  3  . metFORMIN (GLUCOPHAGE) 500 MG tablet Take 1 tablet (500 mg total) by mouth 2 (two) times daily with a meal.  180 tablet  3  . Multiple Vitamins-Minerals (CENTRUM SILVER ULTRA WOMENS PO) Take by mouth.       No current facility-administered medications on file prior to visit.      Review of Systems  Constitutional: Positive for chills. Negative for fever.  HENT: Positive for sore throat.   Respiratory: Negative.   Cardiovascular: Negative.   Gastrointestinal: Negative.   Genitourinary: Negative.   Neurological: Positive for headaches.   BP 143/91  Pulse 73  Temp(Src) 98.3 F (36.8 C) (Oral)  Resp 14  Ht 5' 7.5" (1.715 m)  Wt 213 lb (96.616 kg)  BMI 32.85 kg/m2  SpO2 99%     Objective:   Physical Exam  Constitutional: She is oriented to person, place, and time. She appears well-developed and well-nourished. No distress.  HENT:  Head: Normocephalic and atraumatic.  Right Ear: External ear normal.  Left Ear: External ear normal.  Significant pharyngeal erythema and irritation  Cardiovascular: Normal rate.   Pulmonary/Chest: Effort normal.   Lymphadenopathy:    She has cervical adenopathy.  Neurological: She is alert and oriented to person, place, and time.  Skin: Skin is warm and dry.  Psychiatric: She has a normal mood and affect. Her behavior is normal. Judgment and thought content normal.          Assessment & Plan:

## 2012-05-09 NOTE — Patient Instructions (Addendum)
Start antibiotic:   Augmenting 2 times daily for 10 days   Gargle with salt water   Chloraseptic spray to alleviate the pain   Tylenol or ibuprofen for pain or fever   Drink fluids to stay hydrated

## 2012-05-29 ENCOUNTER — Encounter: Payer: Self-pay | Admitting: Internal Medicine

## 2012-06-05 ENCOUNTER — Ambulatory Visit: Payer: 59 | Admitting: Internal Medicine

## 2012-08-20 ENCOUNTER — Other Ambulatory Visit: Payer: Self-pay | Admitting: *Deleted

## 2012-08-20 MED ORDER — METFORMIN HCL 500 MG PO TABS: 500.0000 mg | ORAL_TABLET | Freq: Two times a day (BID) | ORAL | Status: AC

## 2012-12-19 ENCOUNTER — Other Ambulatory Visit: Payer: Self-pay

## 2014-06-02 NOTE — H&P (Signed)
History of Present Illness 56 yowf w/ 24 hr h/o RLQ abdominal pain, nausea, and anorexia. Very thirsty. Last ate breakfast > 12 hours ago.    Past History Obesity    Past Medical Health Diabetes Mellitus   Past Med/Surgical Hx:  CVA:   chronic sinus infection:   back problems.:   Hysterectomy - Partial:   ALLERGIES:  Biaxin: Rash  HOME MEDICATIONS: Medication Instructions Status  aspirin 325 mg oral tablet tab(s)  once a day  Active  flonase prn  Active  metformin 500 mg oral tablet 1 tab(s) orally 2 times a day Active   Family and Social History:   Family History Non-Contributory    Social History negative tobacco, negative ETOH, negative Illicit drugs    Place of Living Home  married, 2 grown kids, 4 adopted kids at home, works in [employer]:   Fever/Chills No    Cough No    Sputum No    Abdominal Pain Yes    Diarrhea No    Constipation No    Nausea/Vomiting Yes    SOB/DOE No    Chest Pain No    Dysuria No    Tolerating PT Yes    Tolerating Diet No  Nauseated    Medications/Allergies Reviewed Medications/Allergies reviewed   Physical Exam:   GEN well developed, well nourished, no acute distress    HEENT pink conjunctivae, PERRL, hearing intact to voice, moist oral mucosa, Oropharynx clear    NECK supple  trachea midline    RESP normal resp effort  no use of accessory muscles    CARD regular rate  no murmur  no JVD    ABD positive tenderness  exquisite RLQ tenderness with rebound and guarding    EXTR negative cyanosis/clubbing, negative edema    SKIN normal to palpation, No ulcers    NEURO cranial nerves intact, follows commands, strength:, motor/sensory function intact    PSYCH alert, A+O to time, place, person, good insight   Lab Results: Hepatic:  29-Jul-13 16:56    Bilirubin, Total 0.7   Alkaline Phosphatase  137   SGPT (ALT) 75 (12-78 NOTE: NEW REFERENCE RANGE 01/06/2011)   SGOT (AST) 37   Total Protein,  Serum  8.3   Albumin, Serum 4.1  Routine Chem:  29-Jul-13 16:56    Glucose, Serum  114   BUN 11   Creatinine (comp) 0.82   Sodium, Serum 139   Potassium, Serum 3.7   Chloride, Serum 106   CO2, Serum 23   Calcium (Total), Serum 9.4   Osmolality (calc) 278   eGFR (African American) >60   eGFR (Non-African American) >60 (eGFR values <50m/min/1.73 m2 may be an indication of chronic kidney disease (CKD). Calculated eGFR is useful in patients with stable renal function. The eGFR calculation will not be reliable in acutely ill patients when serum creatinine is changing rapidly. It is not useful in  patients on dialysis. The eGFR calculation may not be applicable to patients at the low and high extremes of body sizes, pregnant women, and vegetarians.)   Anion Gap 10   Lipase 86 (Result(s) reported on 11 Sep 2011 at 05:50PM.)  Routine UA:  29-Jul-13 20:30    Color (UA) Yellow   Clarity (UA) Hazy   Glucose (UA) Negative   Bilirubin (UA) Negative   Ketones (UA) Negative   Specific Gravity (UA) 1.049   Blood (UA) Negative   pH (UA) 5.0   Protein (UA) Negative  Nitrite (UA) Negative   Leukocyte Esterase (UA) Negative (Result(s) reported on 11 Sep 2011 at 09:43PM.)   RBC (UA) 9 /HPF   WBC (UA) NONE SEEN   Bacteria (UA) NONE SEEN   Epithelial Cells (UA) <1 /HPF   Mucous (UA) PRESENT (Result(s) reported on 11 Sep 2011 at 09:43PM.)  Routine Hem:  29-Jul-13 16:56    WBC (CBC)  14.2   RBC (CBC)  5.56   Hemoglobin (CBC) 15.6   Hematocrit (CBC) 46.9   Platelet Count (CBC)  106 (Result(s) reported on 11 Sep 2011 at 06:18PM.)   MCV 84   MCH 28.1   MCHC 33.3   RDW 13.8     Assessment/Admission Diagnosis Acute appendicitis    Plan Laparoscopic appendectomy   Electronic Signatures: Consuela Mimes (MD)  (Signed 29-Jul-13 21:55)  Authored: CHIEF COMPLAINT and HISTORY, PAST MEDICAL/SURGIAL HISTORY, ALLERGIES, HOME MEDICATIONS, FAMILY AND SOCIAL HISTORY, REVIEW OF SYSTEMS,  PHYSICAL EXAM, LABS, ASSESSMENT AND PLAN   Last Updated: 29-Jul-13 21:55 by Consuela Mimes (MD)

## 2014-06-02 NOTE — Op Note (Signed)
PATIENT NAME:  Katie Bernard, Katie Bernard MR#:  [id number] DATE OF BIRTH:  1955-10-23  DATE OF PROCEDURE:  09/11/2011  OPERATION PERFORMED: Laparoscopic appendectomy.   PREOPERATIVE DIAGNOSIS: Acute appendicitis.   POSTOPERATIVE DIAGNOSIS: Acute appendicitis, exudative.   SURGEON: Consuela Mimes, MD   ANESTHESIA: General.   PROCEDURE IN DETAIL: The patient was placed supine on the operating room table and prepped and draped in the usual sterile fashion. A one-inch incision was made in the supraumbilical midline and this was carried down through extensive subcutaneous tissue to the fascia but because it was approximately three inches deep, I elected to perform a Hassan needle cannulation of the midline fascia and created a 15 mmHg CO2 pneumoperitoneum via that. I replaced that needle with a 5 mm trocar and a 30 degree angled scope and then placed an additional 5 mm trocar in the suprapubic midline and then converted the 5 mm trocar in the supraumbilical midline to a 12 mm trocar. An additional 5 mm trocar was placed in the left lower quadrant. The patient had some adhesions from the omentum to the anterior abdominal wall in the lower midline and these were taken down with the Harmonic scalpel and the appendix was exudative and adherent back to the cecum and it was dissected off bluntly and the mesoappendix was taken down with the Harmonic scalpel and the appendectomy was performed with the EndoGIA stapling device just where the appendix met the cecum. There was some bleeding from the appendicular artery and this was controlled with two large hemoclips. The appendix was placed in an EndoCatch bag and extracted from the abdomen via the supraumbilical midline site but the bag had a microperforation in it such that the sterility of the extensive subcutaneous tissue there was no longer assured. The right lower quadrant was irrigated with copious amounts of warm normal saline. This was suctioned clear including from  the right upper quadrant and from the pelvis (which contained some adhesions from the patient's hysterectomy). The omentum was then draped over top of the small intestine down towards the pelvis and over top of the cecum and the linea alba in the supraumbilical midline was closed with a single #1 Vicryl suture using the laparoscopic puncture closure device. The fascial defect here was actually quite small and was oriented in a slitlike fashion and this closed easily with a single suture. Peritoneum was then desufflated and decannulated and all three skin sites were closed with skin stapling device and sterile dressing was applied. This was used because the sterility of the supraumbilical midline subcutaneous wound was not assured despite the fact it was irrigated with copious amounts of warm normal saline prior to the loose staple closure. The patient tolerated the procedure well. There were no complications.   ____________________________ Consuela Mimes, MD wfm:drc D: 09/12/2011 00:13:57 ET T: 09/12/2011 10:51:04 ET JOB#: [id number]  cc: Consuela Mimes, MD, <Dictator> Consuela Mimes MD ELECTRONICALLY SIGNED 09/15/2011 0:31

## 2016-03-03 ENCOUNTER — Emergency Department
Admission: EM | Admit: 2016-03-03 | Discharge: 2016-03-03 | Disposition: A | Discharge status: Home / Self Care | Payer: Medicaid Other | Attending: Emergency Medicine | Admitting: Emergency Medicine

## 2016-03-03 ENCOUNTER — Emergency Department: Payer: Medicaid Other

## 2016-03-03 DIAGNOSIS — R519 Headache, unspecified: Secondary | ICD-10-CM

## 2016-03-03 DIAGNOSIS — R51 Headache: Secondary | ICD-10-CM | POA: Insufficient documentation

## 2016-03-03 DIAGNOSIS — Z87891 Personal history of nicotine dependence: Secondary | ICD-10-CM | POA: Insufficient documentation

## 2016-03-03 DIAGNOSIS — Z7984 Long term (current) use of oral hypoglycemic drugs: Secondary | ICD-10-CM | POA: Insufficient documentation

## 2016-03-03 DIAGNOSIS — I10 Essential (primary) hypertension: Secondary | ICD-10-CM | POA: Diagnosis not present

## 2016-03-03 DIAGNOSIS — E119 Type 2 diabetes mellitus without complications: Secondary | ICD-10-CM | POA: Insufficient documentation

## 2016-03-03 DIAGNOSIS — Z79899 Other long term (current) drug therapy: Secondary | ICD-10-CM | POA: Diagnosis not present

## 2016-03-03 DIAGNOSIS — R42 Dizziness and giddiness: Secondary | ICD-10-CM | POA: Diagnosis not present

## 2016-03-03 HISTORY — DX: Gastro-esophageal reflux disease without esophagitis: K21.9

## 2016-03-03 LAB — COMPREHENSIVE METABOLIC PANEL WITH GFR
ALT: 106 U/L — ABNORMAL HIGH (ref 14–54)
AST: 73 U/L — ABNORMAL HIGH (ref 15–41)
Albumin: 4.1 g/dL (ref 3.5–5.0)
Alkaline Phosphatase: 82 U/L (ref 38–126)
Anion gap: 9 (ref 5–15)
BUN: 14 mg/dL (ref 6–20)
CO2: 24 mmol/L (ref 22–32)
Calcium: 9.6 mg/dL (ref 8.9–10.3)
Chloride: 105 mmol/L (ref 101–111)
Creatinine, Ser: 0.84 mg/dL (ref 0.44–1.00)
GFR calc Af Amer: >60 mL/min
GFR calc non Af Amer: >60 mL/min
Glucose, Bld: 207 mg/dL — ABNORMAL HIGH (ref 65–99)
Potassium: 3.7 mmol/L (ref 3.5–5.1)
Sodium: 138 mmol/L (ref 135–145)
Total Bilirubin: 0.6 mg/dL (ref 0.3–1.2)
Total Protein: 7.9 g/dL (ref 6.5–8.1)

## 2016-03-03 LAB — CBC
HCT: 41.2 % (ref 35.0–47.0)
Hemoglobin: 13.8 g/dL (ref 12.0–16.0)
MCH: 27.9 pg (ref 26.0–34.0)
MCHC: 33.4 g/dL (ref 32.0–36.0)
MCV: 83.5 fL (ref 80.0–100.0)
Platelets: 187 10*3/uL (ref 150–440)
RBC: 4.94 MIL/uL (ref 3.80–5.20)
RDW: 14 % (ref 11.5–14.5)
WBC: 8 10*3/uL (ref 3.6–11.0)

## 2016-03-03 LAB — LIPASE, BLOOD: Lipase: 21 U/L (ref 11–51)

## 2016-03-03 MED ORDER — BUTALBITAL-APAP-CAFFEINE 50-325-40 MG PO TABS
2.0000 | ORAL_TABLET | ORAL | Status: AC
  Administered 2016-03-03: 2 via ORAL
  Filled 2016-03-03: qty 2

## 2016-03-03 MED ORDER — MECLIZINE HCL 25 MG PO TABS: 25.0000 mg | ORAL_TABLET | Freq: Three times a day (TID) | ORAL | 0 refills | Status: DC | PRN

## 2016-03-03 MED ORDER — MECLIZINE HCL 25 MG PO TABS
25.0000 mg | ORAL_TABLET | Freq: Once | ORAL | Status: AC
  Administered 2016-03-03: 25 mg via ORAL
  Filled 2016-03-03: qty 1

## 2016-03-03 NOTE — Discharge Instructions (Signed)
We believe your symptoms were caused by benign vertigo.  Please read through the included information and take any prescribed medication(s).  Follow up with your doctor as listed above.  Fortunately your MRIs were reassuring with no evidence of acute stroke or other emergent medical condition.  If you develop any new or worsening symptoms that concern you, including but not limited to persistent dizziness/vertigo, numbness or weakness in your arms or legs, altered mental status, persistent vomiting, or fever greater than 101, please return immediately to the Emergency Department.

## 2016-03-03 NOTE — ED Notes (Signed)
RN to room to administer medication. Patient has not returned from MRI

## 2016-03-03 NOTE — ED Notes (Signed)
Patient transported to MRI 

## 2016-03-03 NOTE — ED Notes (Signed)
Ambulated patient per MD order. PT denies nausa/dizziness. Pt ambulated with no issue.

## 2016-03-03 NOTE — ED Triage Notes (Signed)
Patient c/o headache beginning this am. At approx 2200 patient became dizzy/lightheaded. Pt then became to have repeated emeses. Pt now also c/o abdominal pain, however believes it is due to multiple emeses.   Patient reports hx diabetes, GERD, hypertension, TIA

## 2016-03-03 NOTE — ED Provider Notes (Signed)
[facility] Emergency Department Provider Note  ____________________________________________   First MD Initiated Contact with Patient 03/03/16 0119     (approximate)  I have reviewed the triage vital signs and the nursing notes.   HISTORY  Chief Complaint Dizziness and Emesis    HPI Katie Bernard is a 61 y.o. female who reports a history of multiple prior TIAs/CVAs but who is not on any anticoagulation presents for evaluation of severe dizziness with vomiting as well as a mild headache.  She reports that she had a mild dull headache all day today but it did not limit her activities.  She feels like she had a productive day and was able to get a lot of work done around the house.  When she was getting ready for bed she had acute onset severe dizziness that felt like the room "was shaking" rather than spinning.  She was unable to walk.  She tried to rest in bed for a few minutes before she tried again and was able ambulate a bit but was "bouncing off the walls" she tried to maintain her balance.  She became increasingly nauseated and then has vomited multiple times since then due to the persistent nausea and dizziness.  She states that any movement makes her symptoms worsen holding still makes it better and her nausea is currently only mild as long she is not turning her head or trying to walk.  Her headache has improved.  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  She denies fever/chills, neck stiffness, chest pain, shortness of breath, abdominal pain (other than the discomfort from the repeated vomiting).  Past Medical History:  Diagnosis Date  . Broken arm 2009   left, below elbow  . Broken foot 2009   right  . DM (diabetes mellitus) (HCC)   . GERD (gastroesophageal reflux disease)   . Hemorrhoids   . Hypertension   . IBS (irritable bowel syndrome)   . Menorrhagia   . Obesity   . 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 Sore throat 05/09/2012  . Chronic otitis media 04/25/2012  . Hyperlipidemia 03/21/2012  . NASH (nonalcoholic steatohepatitis) 11/14/2011  . Diabetes mellitus type 2 in obese (HCC) 11/14/2011  . Obesity (BMI 30-39.9) 07/12/2011  . Screening for breast cancer 07/12/2011  . Hypertension 10/27/2010    Past Surgical History:  Procedure Laterality Date  . APPENDECTOMY  09/11/11   [facility]  . MOUTH SURGERY    . TUBAL LIGATION     age 71  . VAGINAL HYSTERECTOMY     for menorrhagia, partial    Prior to Admission medications   Medication Sig Start Date End Date Taking? Authorizing Provider  amoxicillin-clavulanate (AUGMENTIN) 875-125 MG per tablet Take 1 tablet by mouth 2 (two) times daily. 05/09/12   Raquel Conni Elliot, NP  diphenhydramine-acetaminophen (TYLENOL PM) 25-500 MG TABS Take 2 tablets by mouth at bedtime.    Historical Provider, MD  fluticasone (FLONASE) 50 MCG/ACT nasal spray Place 1 spray into the nose daily. 01/18/12   Shelia Media, MD  lisinopril (PRINIVIL,ZESTRIL) 10 MG tablet Take 1 tablet (10 mg total) by mouth daily. 04/25/12   Shelia Media, MD  meclizine (ANTIVERT) 25 MG tablet Take 1 tablet (25 mg total) by mouth 3 (  three) times daily as needed for dizziness. 03/03/16   Loleta Rose, MD  metFORMIN (GLUCOPHAGE) 500 MG tablet Take 1 tablet (500 mg total) by mouth 2 (two) times daily with a meal. 08/20/12 08/20/13  Shelia Media, MD  Multiple Vitamins-Minerals (CENTRUM SILVER ULTRA WOMENS PO) Take by mouth.    Historical Provider, MD    Allergies Biaxin [clarithromycin]  Family History  Problem Relation Age of Onset  . Hypertension Father   . Hypertension Sister   . Colon cancer Maternal Aunt   . Breast cancer Maternal Grandmother   . Diabetes      mat. ggm  . Diabetes Sister     Social History Social History  Substance Use Topics   . Smoking status: Former Smoker    Quit date: 10/26/2001  . Smokeless tobacco: Never Used  . Alcohol use No    Review of Systems Constitutional: No fever/chills Eyes: No visual changes (chronic visual loss in left eye) ENT: No sore throat. Cardiovascular: Denies chest pain. Respiratory: Denies shortness of breath. Gastrointestinal: \Multiple episodes of emesis after which she developed some aching in her abdomen Genitourinary: Negative for dysuria. Musculoskeletal: Negative for back pain. Skin: Negative for rash. Neurological: Mild dull headache today, now improved.  Persistent dizziness/vertigo  10-point ROS otherwise negative.  ____________________________________________   PHYSICAL EXAM:  VITAL SIGNS: ED Triage Vitals  Enc Vitals Group     BP 03/03/16 0044 (!) 145/81     Pulse Rate 03/03/16 0044 72     Resp 03/03/16 0044 15     Temp 03/03/16 0044 98.7 F (37.1 C)     Temp Source 03/03/16 0044 Oral     SpO2 03/03/16 0044 98 %     Weight 03/03/16 0048 245 lb (111.1 kg)     Height 03/03/16 0048 5\' 7"  (1.702 m)     Head Circumference --      Peak Flow --      Pain Score 03/03/16 0048 2     Pain Loc --      Pain Edu? --      Excl. in GC? --     Constitutional: Alert and oriented. Well appearing and in no acute distress. Eyes: Conjunctivae are normal. PERRL. EOMI. No nystagmus. Head: Atraumatic. Nose: No congestion/rhinnorhea. Mouth/Throat: Mucous membranes are moist.  Oropharynx non-erythematous. Neck: No stridor.  No meningeal signs.   Cardiovascular: Normal rate, regular rhythm. Good peripheral circulation. Grossly normal heart sounds. Respiratory: Normal respiratory effort.  No retractions. Lungs CTAB. Gastrointestinal: Soft and nontender. No distention.  Musculoskeletal: No lower extremity tenderness nor edema. No gross deformities of extremities. Neurologic:  Normal speech and language. No gross focal neurologic deficits are appreciated including no gross  cranial nerve deficits.  She has no dysmetria on finger to nose testing and has normal grip strength in upper and lower muscle group strength.  I did not test ambulation or standing due to her persistent vertigo and her fear that getting up would make it worse. Skin:  Skin is warm, dry and intact. No rash noted. Psychiatric: Mood and affect are normal. Speech and behavior are normal.  ____________________________________________   LABS (all labs ordered are listed, but only abnormal results are displayed)  Labs Reviewed  COMPREHENSIVE METABOLIC PANEL - Abnormal; Notable for the following:       Result Value   Glucose, Bld 207 (*)    AST 73 (*)    ALT 106 (*)    All other components within normal limits  LIPASE, BLOOD  CBC  URINALYSIS, COMPLETE (UACMP) WITH MICROSCOPIC   ____________________________________________  EKG  ED ECG REPORT I, Laylamarie Meuser, the attending physician, personally viewed and interpreted this ECG.  Date: 03/03/2016 EKG Time: 00:52 Rate: 74 Rhythm: normal sinus rhythm QRS Axis: normal Intervals: normal ST/T Wave abnormalities: normal Conduction Disturbances: none Narrative Interpretation: unremarkable  ____________________________________________  RADIOLOGY   Mr Angiogram Head Wo Contrast  Result Date: 03/03/2016 CLINICAL DATA:  61 y/o F; headache, dizziness, and lightheadedness with repeated emesis. EXAM: MRI HEAD WITHOUT CONTRAST MRA HEAD WITHOUT CONTRAST TECHNIQUE: Multiplanar, multiecho pulse sequences of the brain and surrounding structures were obtained without intravenous contrast. Angiographic images of the head were obtained using MRA technique without contrast. COMPARISON:  06/08/2010 MRI of the brain. FINDINGS: MRI HEAD FINDINGS Brain: No acute infarction, hemorrhage, hydrocephalus, extra-axial collection or mass lesion. Minimal T2 FLAIR hyperintense signal abnormality of periventricular white matter likely represents chronic microvascular  ischemic changes. Vascular: As below. Skull and upper cervical spine: Subcentimeter T1 and T2 hyperintense focus within the left occipital bone is likely a hemangioma. Otherwise no abnormal marrow signal. Sinuses/Orbits: Negative. Other: None. MRA HEAD FINDINGS Internal carotid arteries:  Patent. Anterior cerebral arteries:  Patent. Middle cerebral arteries: Patent. Anterior communicating artery: Probable diminutive vessel. Posterior communicating arteries:  Patent. Posterior cerebral arteries: Patent. Large left posterior communicating artery with small left P1 segment compatible with persistent fetal circulation. Basilar artery:  Patent. Vertebral arteries:  Patent. No evidence of high-grade stenosis, large vessel occlusion, or aneurysm unless noted above. IMPRESSION: 1. No acute intracranial abnormality. 2. Minimal chronic microvascular ischemic changes. 3. No large vessel occlusion, high-grade stenosis, or aneurysm of circle of Willis identified. Electronically Signed   By: Mitzi HansenLance  Furusawa-Stratton M.D.   On: 03/03/2016 03:43   Mr Brain Wo Contrast  Result Date: 03/03/2016 CLINICAL DATA:  61 y/o F; headache, dizziness, and lightheadedness with repeated emesis. EXAM: MRI HEAD WITHOUT CONTRAST MRA HEAD WITHOUT CONTRAST TECHNIQUE: Multiplanar, multiecho pulse sequences of the brain and surrounding structures were obtained without intravenous contrast. Angiographic images of the head were obtained using MRA technique without contrast. COMPARISON:  06/08/2010 MRI of the brain. FINDINGS: MRI HEAD FINDINGS Brain: No acute infarction, hemorrhage, hydrocephalus, extra-axial collection or mass lesion. Minimal T2 FLAIR hyperintense signal abnormality of periventricular white matter likely represents chronic microvascular ischemic changes. Vascular: As below. Skull and upper cervical spine: Subcentimeter T1 and T2 hyperintense focus within the left occipital bone is likely a hemangioma. Otherwise no abnormal marrow  signal. Sinuses/Orbits: Negative. Other: None. MRA HEAD FINDINGS Internal carotid arteries:  Patent. Anterior cerebral arteries:  Patent. Middle cerebral arteries: Patent. Anterior communicating artery: Probable diminutive vessel. Posterior communicating arteries:  Patent. Posterior cerebral arteries: Patent. Large left posterior communicating artery with small left P1 segment compatible with persistent fetal circulation. Basilar artery:  Patent. Vertebral arteries:  Patent. No evidence of high-grade stenosis, large vessel occlusion, or aneurysm unless noted above. IMPRESSION: 1. No acute intracranial abnormality. 2. Minimal chronic microvascular ischemic changes. 3. No large vessel occlusion, high-grade stenosis, or aneurysm of circle of Willis identified. Electronically Signed   By: Mitzi HansenLance  Furusawa-Stratton M.D.   On: 03/03/2016 03:43    ____________________________________________   PROCEDURES  Procedure(s) performed:   Procedures   Critical Care performed: No ____________________________________________   INITIAL IMPRESSION / ASSESSMENT AND PLAN / ED COURSE  Pertinent labs & imaging results that were available during my care of the patient were reviewed by me and considered in my medical decision making (see chart  for details).  I believe the patient most likely is suffering from benign positional vertigo.  However she has a history of CVA/TIA, does not take anticoagulation, and may be suffering from a CVA rather than simply benign vertigo.  I will evaluate with MR Head and MRA Brain without contrast and give a dose of meclizine while waiting.  She agrees with the plan.  No evidence of acute infectious process.   Clinical Course as of Mar 03 429  Fri Mar 03, 2016  0350 Reassuring MR results.    [CF]  [phone] Updated the patient regarding the normal MRs.  she states she feels better after the meclizine.  We will see how she does with ambulation with assistance but she states that she  feels well enough she thinks she can go home and will call somebody to come pick her up.  I will provide a prescription for meclizine  [CF]    Clinical Course User Index [CF] Loleta Rose, MD    ____________________________________________  FINAL CLINICAL IMPRESSION(S) / ED DIAGNOSES  Final diagnoses:  Vertigo  Acute nonintractable headache, unspecified headache type     MEDICATIONS GIVEN DURING THIS VISIT:  Medications  meclizine (ANTIVERT) tablet 25 mg (25 mg Oral Given 03/03/16 0325)  butalbital-acetaminophen-caffeine (FIORICET, ESGIC) 50-325-40 MG per tablet 2 tablet (2 tablets Oral Given 03/03/16 0411)     NEW OUTPATIENT MEDICATIONS STARTED DURING THIS VISIT:  New Prescriptions   MECLIZINE (ANTIVERT) 25 MG TABLET    Take 1 tablet (25 mg total) by mouth 3 (three) times daily as needed for dizziness.    Modified Medications   No medications on file    Discontinued Medications   No medications on file     Note:  This document was prepared using Dragon voice recognition software and may include unintentional dictation errors.    Loleta Rose, MD 03/03/16 [phone]

## 2017-08-02 IMAGING — MR MR HEAD W/O CM
11 series · 42 of 48 positions shown · non-contrast
Comparison: 06/08/2010 MRI of the brain.

CLINICAL DATA: 60 y/o F; headache, dizziness, and lightheadedness
with repeated emesis.

EXAM:
MRI HEAD WITHOUT CONTRAST
MRA HEAD WITHOUT CONTRAST
TECHNIQUE: Multiplanar, multiecho pulse sequences of the brain and surrounding
structures were obtained without intravenous contrast. Angiographic
images of the head were obtained using MRA technique without
contrast.

[Series 2: T1 · sagittal · 5.0mm · 0.45mm/px · 2 of 29 slices shown (1 of 2)]
[im 1/29]
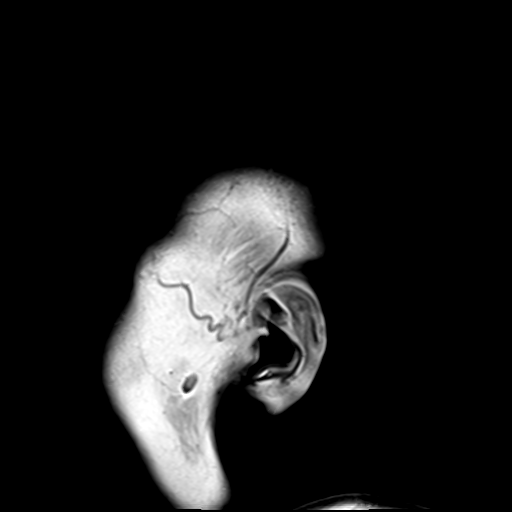
[im 29/29]
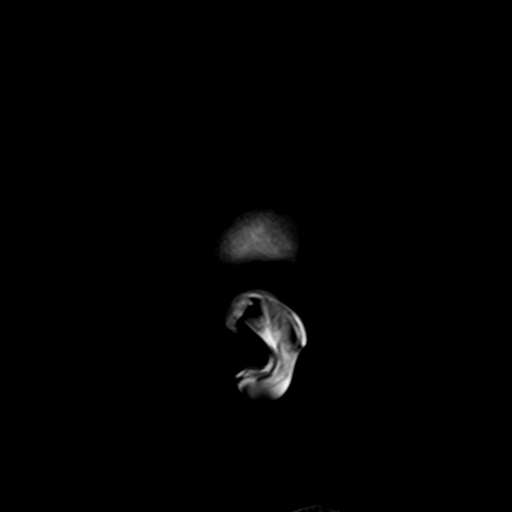

[Series 4: DWI · axial · 3.0mm · 1.80mm/px · z∈[-78,+81]mm · 5 of 55 slices shown (1 of 4)]
[im 1/55]
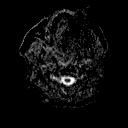
[im 14/55]
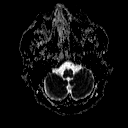
[im 28/55]
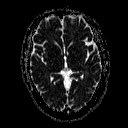
[im 41/55]
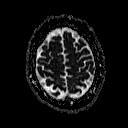
[im 55/55]
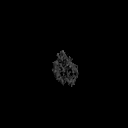

[Series 6: DWI · coronal · 3.0mm · 1.80mm/px · 4 of 49 slices shown (2 of 4)]
[im 1/49]
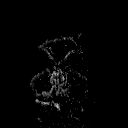
[im 17/49]
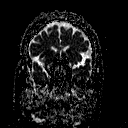
[im 33/49]
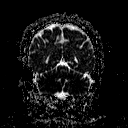
[im 49/49]
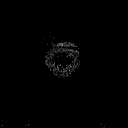

[Series 7: T2 · axial · 5.0mm · 0.60mm/px · z∈[-78,+82]mm · 2 of 26 slices shown (1 of 3)]
[im 1/26]
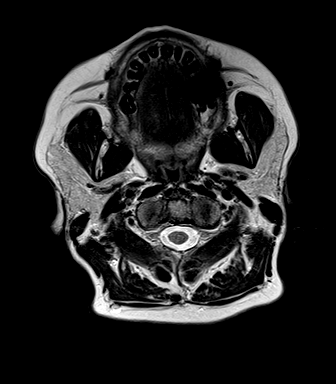
[im 26/26]
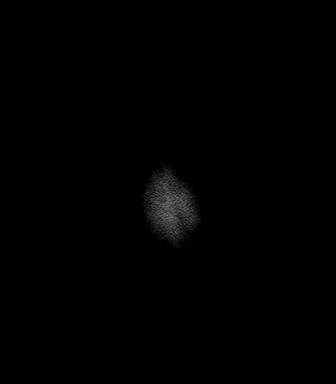

[Series 8: FLAIR · axial · 5.0mm · 0.45mm/px · z∈[-78,+82]mm · 2 of 26 slices shown]
[im 1/26]
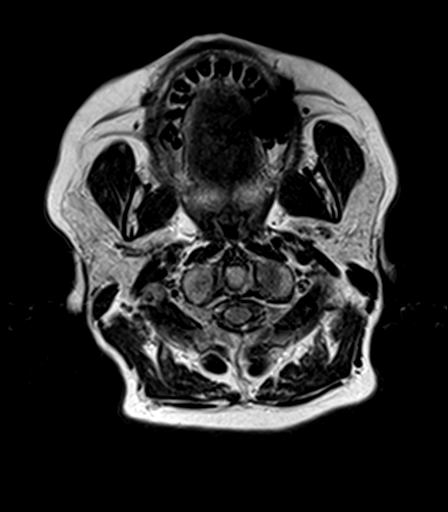
[im 26/26]
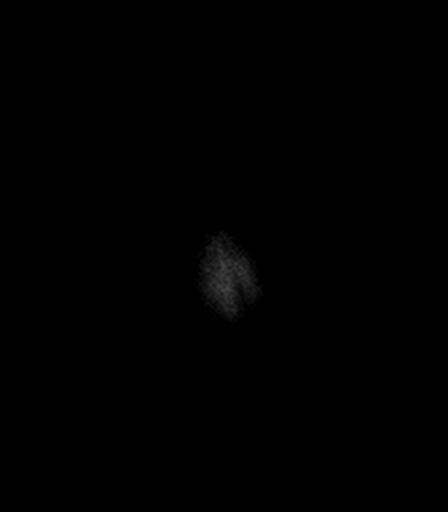

[Series 9: T2 · axial · 5.0mm · 0.45mm/px · z∈[-78,+82]mm · 2 of 26 slices shown (2 of 3)]
[im 1/26]
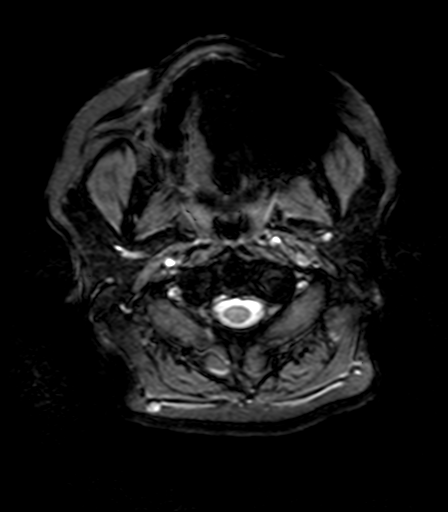
[im 26/26]
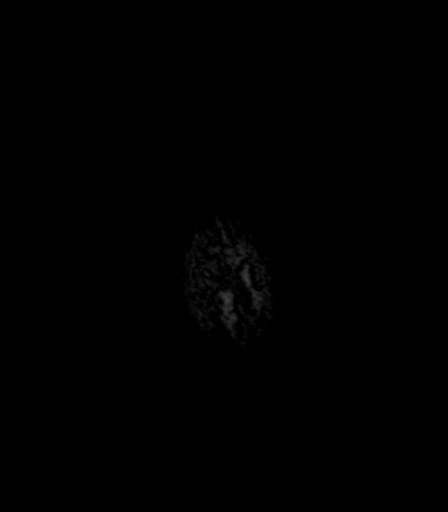

[Series 10: T1 · axial · 3.0mm · 1.00mm/px · z∈[-71,+80]mm · 4 of 52 slices shown (2 of 2)]
[im 1/52]
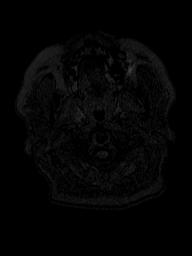
[im 18/52]
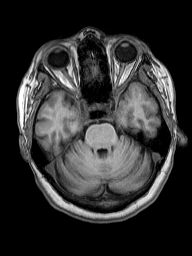
[im 35/52]
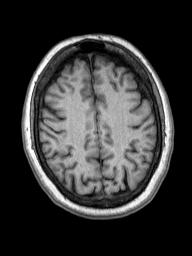
[im 52/52]
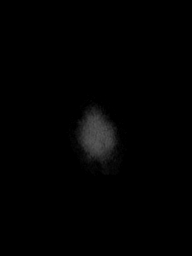

[Series 11: T2 · coronal · 5.0mm · 0.49mm/px · 3 of 31 slices shown (3 of 3)]
[im 1/31]
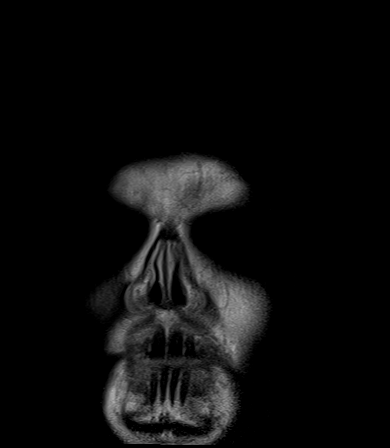
[im 16/31]
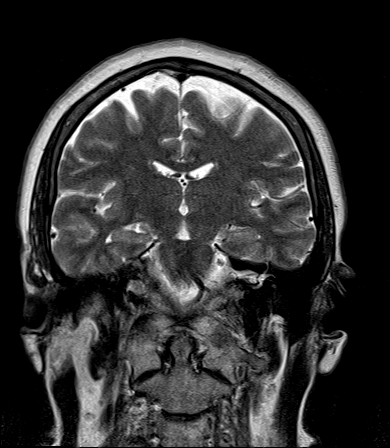
[im 31/31]
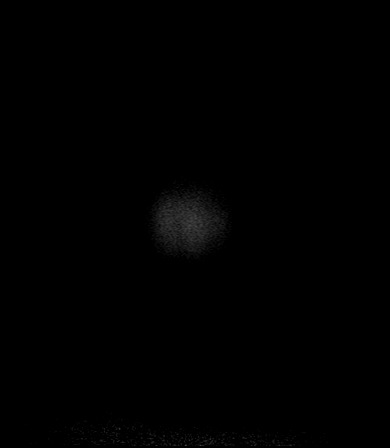

[Series 12: TOF · axial · non-contrast · 0.7mm · 0.37mm/px · z∈[-110,+14]mm · 9 of 180 slices shown]
[im 1/180]
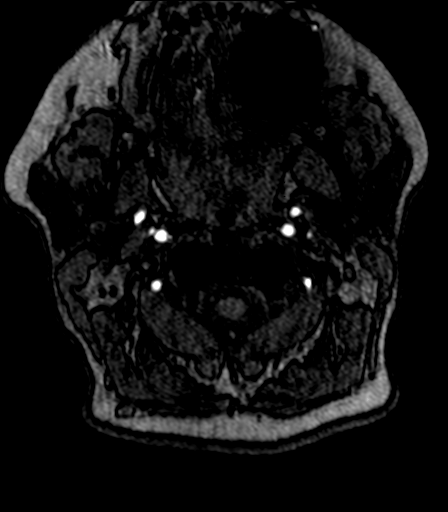
[im 13/180]
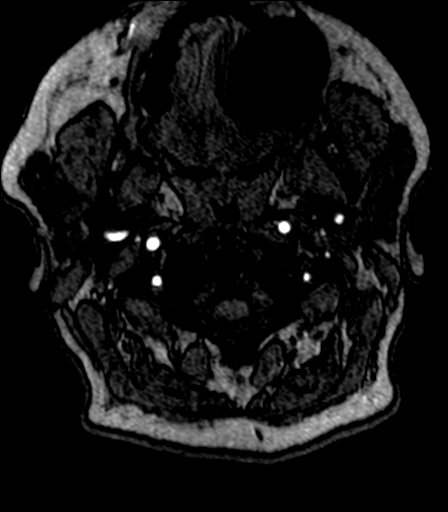
[im 26/180]
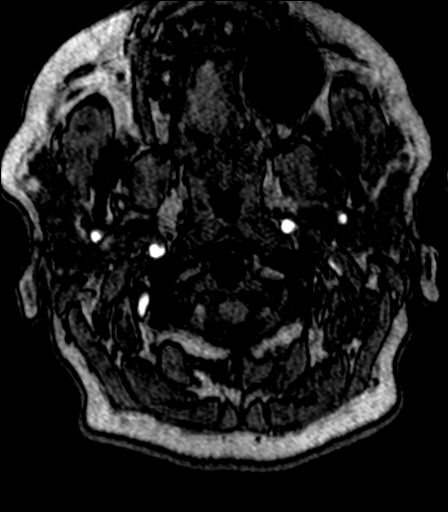
[im 52/180]
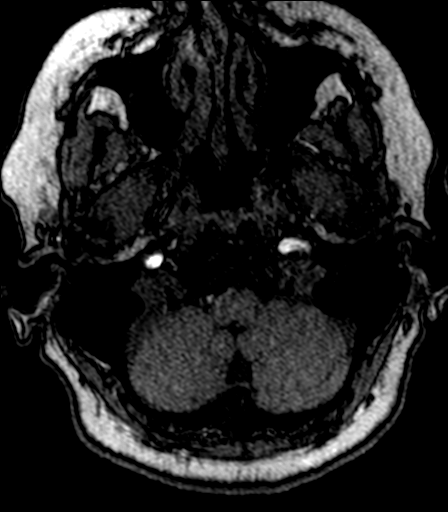
[im 77/180]
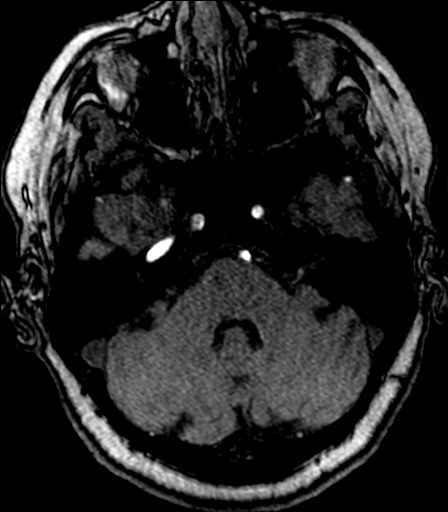
[im 103/180]
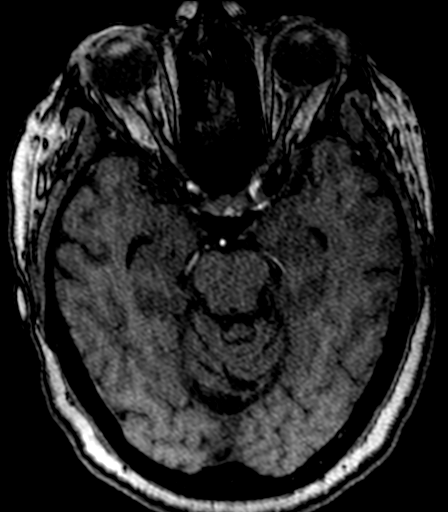
[im 128/180]
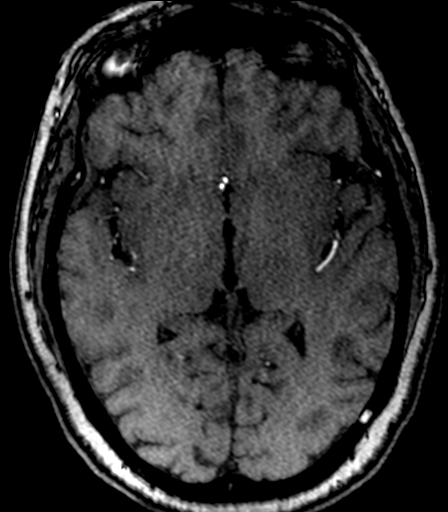
[im 154/180]
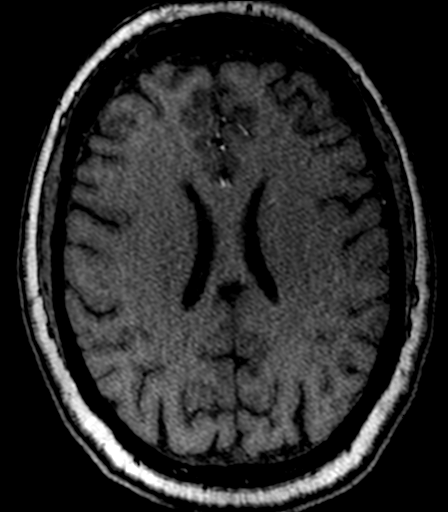
[im 180/180]
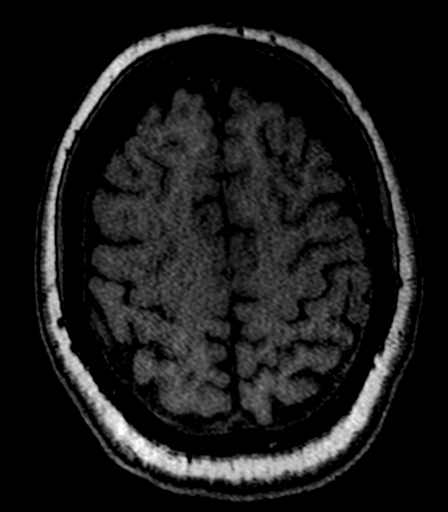

[Series 100: DWI · axial · 3.0mm · 1.80mm/px · z∈[-78,+81]mm · 5 of 55 slices shown (3 of 4)]
[im 1/55]
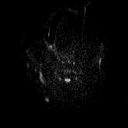
[im 14/55]
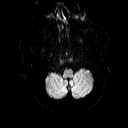
[im 28/55]
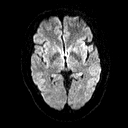
[im 41/55]
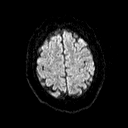
[im 55/55]
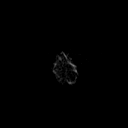

[Series 101: DWI · coronal · 3.0mm · 1.80mm/px · 4 of 46 slices shown (4 of 4)]
[im 1/46]
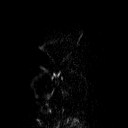
[im 16/46]
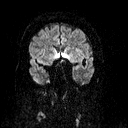
[im 31/46]
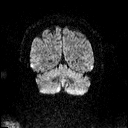
[im 46/46]
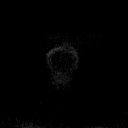

[42 of 48 positions shown; findings below may reference images not displayed]

FINDINGS: MRI HEAD FINDINGS

Brain: No acute infarction, hemorrhage, hydrocephalus, extra-axial
collection or mass lesion. Minimal T2 FLAIR hyperintense signal
abnormality of periventricular white matter likely represents
chronic microvascular ischemic changes.

Vascular: As below.

Skull and upper cervical spine: Subcentimeter T1 and T2 hyperintense
focus within the left occipital bone is likely a hemangioma.
Otherwise no abnormal marrow signal.

Sinuses/Orbits: Negative.

Other: None.

MRA HEAD FINDINGS

Internal carotid arteries:  Patent.

Anterior cerebral arteries:  Patent.

Middle cerebral arteries: Patent.

Anterior communicating artery: Probable diminutive vessel.

Posterior communicating arteries:  Patent.

Posterior cerebral arteries: Patent. Large left posterior
communicating artery with small left P1 segment compatible with
persistent fetal circulation.

Basilar artery:  Patent.

Vertebral arteries:  Patent.

No evidence of high-grade stenosis, large vessel occlusion, or
aneurysm unless noted above.
IMPRESSION: 1. No acute intracranial abnormality.
2. Minimal chronic microvascular ischemic changes.
3. No large vessel occlusion, high-grade stenosis, or aneurysm of
circle of Willis identified.

By: Sevrine Sannier M.D.

## 2017-09-25 ENCOUNTER — Other Ambulatory Visit: Payer: Self-pay | Admitting: Family

## 2017-09-25 DIAGNOSIS — Z1231 Encounter for screening mammogram for malignant neoplasm of breast: Secondary | ICD-10-CM

## 2019-01-24 ENCOUNTER — Other Ambulatory Visit: Payer: Self-pay | Admitting: Family

## 2019-01-24 DIAGNOSIS — Z1231 Encounter for screening mammogram for malignant neoplasm of breast: Secondary | ICD-10-CM

## 2019-11-17 ENCOUNTER — Other Ambulatory Visit: Payer: Self-pay | Admitting: Family

## 2019-11-17 DIAGNOSIS — Z1231 Encounter for screening mammogram for malignant neoplasm of breast: Secondary | ICD-10-CM

## 2020-03-26 ENCOUNTER — Ambulatory Visit
Admission: EM | Admit: 2020-03-26 | Discharge: 2020-03-26 | Disposition: A | Discharge status: Home / Self Care | Payer: Medicaid Other | Attending: Family Medicine | Admitting: Family Medicine

## 2020-03-26 ENCOUNTER — Other Ambulatory Visit: Payer: Self-pay

## 2020-03-26 DIAGNOSIS — S90455A Superficial foreign body, left lesser toe(s), initial encounter: Secondary | ICD-10-CM | POA: Diagnosis not present

## 2020-03-26 MED ORDER — LEVOFLOXACIN 750 MG PO TABS: 750.0000 mg | ORAL_TABLET | Freq: Every day | ORAL | 0 refills | Status: DC

## 2020-03-26 MED ORDER — TRAMADOL HCL 50 MG PO TABS: 50.0000 mg | ORAL_TABLET | Freq: Three times a day (TID) | ORAL | 0 refills | Status: DC | PRN

## 2020-03-26 NOTE — ED Provider Notes (Signed)
[facility]    CSN: [id number] Arrival date & time: 03/26/20  1658      History   Chief Complaint Chief Complaint  Patient presents with  . Foreign Body in Skin   HPI   65 year old female presents with a foreign body in her left great toe.  Patient states that she hopped down off of a chair and got a toothpick stuck in her left great toe.  Area is very painful.  10/10 in severity.  She has been unable to remove it.  No drainage.  No current bleeding.  She is essentially here for removal.  She has no other complaints at this time.  Past Medical History:  Diagnosis Date  . Broken arm 2009   left, below elbow  . Broken foot 2009   right  . DM (diabetes mellitus) (HCC)   . GERD (gastroesophageal reflux disease)   . Hemorrhoids   . Hypertension   . IBS (irritable bowel syndrome)   . Menorrhagia   . Obesity   . 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 Sore throat 05/09/2012  . Chronic otitis media 04/25/2012  . Hyperlipidemia 03/21/2012  . NASH (nonalcoholic steatohepatitis) 11/14/2011  . Diabetes mellitus type 2 in obese (HCC) 11/14/2011  . Obesity (BMI 30-39.9) 07/12/2011  . Screening for breast cancer 07/12/2011  . Hypertension 10/27/2010    Past Surgical History:  Procedure Laterality Date  . APPENDECTOMY  09/11/11   [facility]  . MOUTH SURGERY    . TUBAL LIGATION     age 42  . VAGINAL HYSTERECTOMY     for menorrhagia, partial    OB History   No obstetric history on file.      Home Medications    Prior to Admission medications   Medication Sig Start Date End Date Taking? Authorizing Provider  levofloxacin (LEVAQUIN) 750 MG tablet Take 1 tablet (750 mg total) by mouth daily. 03/26/20  Yes Kijuan Gallicchio G, DO  traMADol (ULTRAM) 50 MG tablet Take 1 tablet (50 mg total) by mouth every 8 (eight) hours as needed for moderate pain or severe pain.  03/26/20  Yes Min Collymore G, DO  diphenhydramine-acetaminophen (TYLENOL PM) 25-500 MG TABS Take 2 tablets by mouth at bedtime.    [provider]  fluticasone (FLONASE) 50 MCG/ACT nasal spray Place 1 spray into the nose daily. 01/18/12   Shelia Media, MD  lisinopril (PRINIVIL,ZESTRIL) 10 MG tablet Take 1 tablet (10 mg total) by mouth daily. 04/25/12   Shelia Media, MD  meclizine (ANTIVERT) 25 MG tablet Take 1 tablet (25 mg total) by mouth 3 (three) times daily as needed for dizziness. 03/03/16   Loleta Rose, MD  metFORMIN (GLUCOPHAGE) 500 MG tablet Take 1 tablet (500 mg total) by mouth 2 (two) times daily with a meal. 08/20/12 08/20/13  Shelia Media, MD  Multiple Vitamins-Minerals (CENTRUM SILVER ULTRA WOMENS PO) Take by mouth.    [provider]    Family History Family History  Problem Relation Age of Onset  . Hypertension Father   . Hypertension Sister   . Colon cancer Maternal Aunt   . Breast cancer Maternal Grandmother   . Diabetes Unknown        mat. ggm  . Diabetes Sister     Social History Social History   Tobacco Use  . Smoking status: Former  Smoker    Quit date: 10/26/2001    Years since quitting: 18.4  . Smokeless tobacco: Never Used  Substance Use Topics  . Alcohol use: No  . Drug use: No     Allergies   Biaxin [clarithromycin]   Review of Systems Review of Systems  Skin:       Foreign body, left great toe   Physical Exam Triage Vital Signs ED Triage Vitals  Enc Vitals Group     BP 03/26/20 1747 (!) 132/91     Pulse Rate 03/26/20 1747 84     Resp 03/26/20 1747 18     Temp 03/26/20 1747 97.9 F (36.6 C)     Temp Source 03/26/20 1747 Oral     SpO2 03/26/20 1747 99 %     Weight 03/26/20 1738 244 lb 14.9 oz (111.1 kg)     Height 03/26/20 1738 5\' 7"  (1.702 m)     Head Circumference --      Peak Flow --      Pain Score 03/26/20 1738 10     Pain Loc --      Pain Edu? --      Excl. in GC? --     Updated Vital Signs BP  (!) 132/91 (BP Location: Left Arm)   Pulse 84   Temp 97.9 F (36.6 C) (Oral)   Resp 18   Ht 5\' 7"  (1.702 m)   Wt 111.1 kg   SpO2 99%   BMI 38.36 kg/m   Visual Acuity Right Eye Distance:   Left Eye Distance:   Bilateral Distance:    Right Eye Near:   Left Eye Near:    Bilateral Near:     Physical Exam Vitals and nursing note reviewed.  Constitutional:      General: She is not in acute distress.    Appearance: Normal appearance.  Eyes:     General:        Right eye: No discharge.        Left eye: No discharge.     Conjunctiva/sclera: Conjunctivae normal.  Pulmonary:     Effort: Pulmonary effort is normal. No respiratory distress.  Skin:    Comments: Left great toe -appreciable foreign body noted coming out of the toe.  Exquisitely tender to palpation.  Neurological:     Mental Status: She is alert.  Psychiatric:        Mood and Affect: Mood normal.        Behavior: Behavior normal.    UC Treatments / Results  Labs (all labs ordered are listed, but only abnormal results are displayed) Labs Reviewed - No data to display  EKG   Radiology No results found.  Procedures Foreign Body Removal  Date/Time: 03/26/2020 7:53 PM Performed by: , DO Authorized by: 05/24/2020, DO   Consent:    Consent obtained:  Verbal   Consent given by:  Patient Location:    Location:  Toe   Toe location:  L great toe   Tendon involvement:  None Pre-procedure details:    Imaging:  None   Neurovascular status: intact   Anesthesia:    Anesthesia method:  Local infiltration   Local anesthetic:  Lidocaine 1% w/o epi Procedure type:    Procedure complexity:  Simple Procedure details:    Removal mechanism: Removed with needle driver.   Foreign bodies recovered:  1   Description:  Long sliver of Lienhard removed.   Intact foreign body removal: yes  Post-procedure details:    Neurovascular status: intact     Skin closure:  None   Dressing:  Antibiotic ointment    Procedure completion:  Tolerated with difficulty   (including critical care time)    Medications Ordered in UC Medications - No data to display  Initial Impression / Assessment and Plan / UC Course  I have reviewed the triage vital signs and the nursing notes.  Pertinent labs & imaging results that were available during my care of the patient were reviewed by me and considered in my medical decision making (see chart for details).    65 year old female presents with a foreign body in her left great toe.  Foreign body was removed.  See above.  Tramadol for pain.  Patient was placed on prophylactic Levaquin.  Supportive care.  Final Clinical Impressions(s) / UC Diagnoses   Final diagnoses:  Foreign body of toe of left foot, initial encounter     Discharge Instructions     Medication as prescribed.  Take care  Dr. Adriana Simas    ED Prescriptions    Medication Sig Dispense Auth. Provider   traMADol (ULTRAM) 50 MG tablet Take 1 tablet (50 mg total) by mouth every 8 (eight) hours as needed for moderate pain or severe pain. 10 tablet Vicky Schleich G, DO   levofloxacin (LEVAQUIN) 750 MG tablet Take 1 tablet (750 mg total) by mouth daily. 5 tablet Everlene Other G, DO     I have reviewed the PDMP during this encounter.   Tommie Sams, [place] 03/26/20 2012

## 2020-03-26 NOTE — Discharge Instructions (Signed)
Medication as prescribed.  Take care  Dr. Grethel Zenk  

## 2020-03-26 NOTE — ED Triage Notes (Signed)
Patient states that she hopped off her chair and a toothpick that was stuck in the carpet went in to her left big toe. Patient states that area is extremely painful.

## 2022-07-13 ENCOUNTER — Other Ambulatory Visit: Payer: Self-pay

## 2022-07-17 ENCOUNTER — Other Ambulatory Visit: Payer: Self-pay | Admitting: *Deleted

## 2022-07-17 ENCOUNTER — Telehealth: Payer: Self-pay | Admitting: *Deleted

## 2022-07-17 DIAGNOSIS — Z1211 Encounter for screening for malignant neoplasm of colon: Secondary | ICD-10-CM

## 2022-07-17 MED ORDER — NA SULFATE-K SULFATE-MG SULF 17.5-3.13-1.6 GM/177ML PO SOLN: 1.0000 | Freq: Once | ORAL | 0 refills | Status: AC

## 2022-07-17 NOTE — Telephone Encounter (Signed)
Gastroenterology Pre-Procedure Review  Request Date: 09/05/2022 Requesting Physician: Dr. Allegra Lai  PATIENT REVIEW QUESTIONS: The patient responded to the following health history questions as indicated:    1. Are you having any GI issues? no 2. Do you have a personal history of Polyps? no 3. Do you have a family history of Colon Cancer or Polyps? yes (aunt and mother had polyps) 4. Diabetes Mellitus? yes (Victoza, metformin, Jardiance) 5. Joint replacements in the past 12 months?no 6. Major health problems in the past 3 months?no 7. Any artificial heart valves, MVP, or defibrillator?no    MEDICATIONS & ALLERGIES:    Patient reports the following regarding taking any anticoagulation/antiplatelet therapy:   Plavix, Coumadin, Eliquis, Xarelto, Lovenox, Pradaxa, Brilinta, or Effient? no Aspirin? yes (81 mg)  Patient confirms/reports the following medications:  Current Outpatient Medications  Medication Sig Dispense Refill   Blood Glucose Monitoring Suppl (ACCU-CHEK GUIDE) w/Device KIT      buPROPion (WELLBUTRIN XL) 150 MG 24 hr tablet Take 150 mg by mouth every morning.     famotidine (PEPCID) 20 MG tablet Take 1 tablet by mouth daily.     JARDIANCE 25 MG TABS tablet Take 25 mg by mouth daily.     Na Sulfate-K Sulfate-Mg Sulf 17.5-3.13-1.6 GM/177ML SOLN Take 1 kit by mouth once for 1 dose. 354 mL 0   VICTOZA 18 MG/3ML SOPN NJECT 0.6MG  UNDER THE SKIN DAILY FOR 1 WEEK THEN INCREASE TO 1.2 MG UNDER THE SKIN DAILY FOR DIABETES     aspirin 81 MG chewable tablet Chew 81 mg by mouth daily.     atorvastatin (LIPITOR) 20 MG tablet Take 20 mg by mouth daily.     fluticasone (FLONASE) 50 MCG/ACT nasal spray Place 1 spray into the nose daily. 48 g 4   lisinopril (PRINIVIL,ZESTRIL) 10 MG tablet Take 1 tablet (10 mg total) by mouth daily. 90 tablet 3   metFORMIN (GLUCOPHAGE) 500 MG tablet Take 1 tablet (500 mg total) by mouth 2 (two) times daily with a meal. 180 tablet 1   Multiple Vitamins-Minerals  (CENTRUM SILVER ULTRA WOMENS PO) Take by mouth.     No current facility-administered medications for this visit.    Patient confirms/reports the following allergies:  Allergies  Allergen Reactions   Biaxin [Clarithromycin] Itching    No orders of the defined types were placed in this encounter.   AUTHORIZATION INFORMATION Primary Insurance: 1D#: Group #:  Secondary Insurance: 1D#: Group #:  SCHEDULE INFORMATION: Date: 09/05/2022 Time: Location:  [facility]

## 2022-07-21 ENCOUNTER — Other Ambulatory Visit: Payer: Self-pay | Admitting: Physician Assistant

## 2022-07-21 DIAGNOSIS — Z1231 Encounter for screening mammogram for malignant neoplasm of breast: Secondary | ICD-10-CM

## 2022-08-10 ENCOUNTER — Ambulatory Visit
Admission: RE | Admit: 2022-08-10 | Discharge: 2022-08-10 | Disposition: A | Discharge status: Home / Self Care | Payer: Medicare HMO | Source: Ambulatory Visit | Attending: Physician Assistant | Admitting: Physician Assistant

## 2022-08-10 DIAGNOSIS — Z1231 Encounter for screening mammogram for malignant neoplasm of breast: Secondary | ICD-10-CM | POA: Insufficient documentation

## 2022-08-15 ENCOUNTER — Inpatient Hospital Stay
Admission: RE | Admit: 2022-08-15 | Discharge: 2022-08-15 | Disposition: A | Discharge status: Home / Self Care | Payer: Self-pay | Source: Ambulatory Visit | Attending: Physician Assistant | Admitting: Physician Assistant

## 2022-08-15 ENCOUNTER — Other Ambulatory Visit: Payer: Self-pay | Admitting: *Deleted

## 2022-08-15 DIAGNOSIS — Z1231 Encounter for screening mammogram for malignant neoplasm of breast: Secondary | ICD-10-CM

## 2022-08-24 ENCOUNTER — Encounter: Payer: Self-pay | Admitting: Endocrinology

## 2022-08-28 ENCOUNTER — Encounter: Payer: Self-pay | Admitting: Gastroenterology

## 2022-08-29 ENCOUNTER — Encounter: Payer: Self-pay | Admitting: Anesthesiology

## 2022-08-29 ENCOUNTER — Telehealth: Payer: Self-pay

## 2022-08-29 NOTE — Telephone Encounter (Addendum)
Spoken to patient and stated that she have to get this schedule before school starts. It is hard for her to take off by then.   Since it depends on the results of echo, patient will have cancel.  Patient will call back.  Selena Batten was notified of the cancellation.

## 2022-08-29 NOTE — Telephone Encounter (Signed)
-----   Message from Nurse Charisse March S sent at 08/29/2022  1:50 PM EDT ----- Regarding: echo Katie Bernard is on the schedule for 7/23.  She saw her cardiologist on 6/26.  Cards wants her to have an echo.  Looks like it is scheduled for 7/26.  She will need to complete that and we will need the results before she can have her procedure here.    Pt has NOT been advised of this.  You will need to let her know and reschedule.  Please let Selena Batten know whether to cancel or put in depot or reschedule.  Thanks

## 2022-08-29 NOTE — Anesthesia Preprocedure Evaluation (Signed)
Anesthesia Evaluation  Patient identified by MRN, date of birth, ID band Patient awake    Reviewed: Allergy & Precautions, NPO status , Patient's Chart, lab work & pertinent test results  Airway Mallampati: II  TM Distance: >3 FB Neck ROM: Full    Dental  (+) Edentulous Upper, Edentulous Lower   Pulmonary neg pulmonary ROS, former smoker   Pulmonary exam normal        Cardiovascular hypertension, +CHF  + dysrhythmias Atrial Fibrillation  Rhythm:Regular Rate:Normal     Neuro/Psych negative neurological ROS  negative psych ROS   GI/Hepatic Neg liver ROS, PUD,GERD  Medicated,,  Endo/Other  diabetes, Type 2, Oral Hypoglycemic Agents    Renal/GU   negative genitourinary   Musculoskeletal  (+) Arthritis ,    Abdominal Normal abdominal exam  (+)   Peds  Hematology  (+) Blood dyscrasia, anemia   Anesthesia Other Findings   Reproductive/Obstetrics                             Anesthesia Physical Anesthesia Plan  ASA: 3  Anesthesia Plan: MAC and Regional   Post-op Pain Management:    Induction: Intravenous  PONV Risk Score and Plan: 1 and Ondansetron, Dexamethasone, Propofol infusion and Treatment may vary due to age or medical condition  Airway Management Planned: Simple Face Mask, Natural Airway and Nasal Cannula  Additional Equipment: None  Intra-op Plan:   Post-operative Plan:   Informed Consent: I have reviewed the patients History and Physical, chart, labs and discussed the procedure including the risks, benefits and alternatives for the proposed anesthesia with the patient or authorized representative who has indicated his/her understanding and acceptance.     Dental advisory given  Plan Discussed with: CRNA  Anesthesia Plan Comments:        Anesthesia Quick Evaluation  

## 2022-08-29 NOTE — Telephone Encounter (Signed)
Please reschedule colonoscopy per [facility]

## 2022-09-05 ENCOUNTER — Ambulatory Visit: Admission: RE | Admit: 2022-09-05 | Payer: Medicare HMO | Source: Home / Self Care | Admitting: Gastroenterology

## 2022-09-05 HISTORY — DX: Cardiac arrhythmia, unspecified: I49.9

## 2022-09-05 SURGERY — COLONOSCOPY WITH PROPOFOL
Anesthesia: Choice
